# Patient Record
Sex: Female | Born: 1970 | Race: Black or African American | Hispanic: No | Marital: Married | Smoking: Former smoker
Health system: Southern US, Community
[De-identification: ages and names within clinical notes are randomized; demographics above are authoritative.]

## PROBLEM LIST (undated history)

## (undated) DIAGNOSIS — D649 Anemia, unspecified: Secondary | ICD-10-CM

## (undated) DIAGNOSIS — M1990 Unspecified osteoarthritis, unspecified site: Secondary | ICD-10-CM

## (undated) DIAGNOSIS — E079 Disorder of thyroid, unspecified: Secondary | ICD-10-CM

## (undated) DIAGNOSIS — G259 Extrapyramidal and movement disorder, unspecified: Secondary | ICD-10-CM

## (undated) DIAGNOSIS — C801 Malignant (primary) neoplasm, unspecified: Secondary | ICD-10-CM

## (undated) DIAGNOSIS — H539 Unspecified visual disturbance: Secondary | ICD-10-CM

## (undated) DIAGNOSIS — G35 Multiple sclerosis: Secondary | ICD-10-CM

## (undated) DIAGNOSIS — G43009 Migraine without aura, not intractable, without status migrainosus: Secondary | ICD-10-CM

## (undated) DIAGNOSIS — C73 Malignant neoplasm of thyroid gland: Secondary | ICD-10-CM

## (undated) DIAGNOSIS — G35D Multiple sclerosis, unspecified: Secondary | ICD-10-CM

## (undated) HISTORY — DX: Unspecified visual disturbance: H53.9

## (undated) HISTORY — DX: Malignant (primary) neoplasm, unspecified: C80.1

## (undated) HISTORY — DX: Disorder of thyroid, unspecified: E07.9

## (undated) HISTORY — DX: Anemia, unspecified: D64.9

## (undated) HISTORY — DX: Extrapyramidal and movement disorder, unspecified: G25.9

## (undated) HISTORY — PX: KNEE SURGERY: SHX244

## (undated) HISTORY — PX: OTHER SURGICAL HISTORY: SHX169

## (undated) HISTORY — DX: Multiple sclerosis, unspecified: G35.D

## (undated) HISTORY — PX: JOINT REPLACEMENT: SHX530

## (undated) HISTORY — DX: Malignant neoplasm of thyroid gland: C73

## (undated) HISTORY — DX: Multiple sclerosis: G35

## (undated) HISTORY — DX: Unspecified osteoarthritis, unspecified site: M19.90

## (undated) HISTORY — DX: Migraine without aura, not intractable, without status migrainosus: G43.009

## (undated) HISTORY — PX: THYROIDECTOMY: SHX17

## (undated) HISTORY — PX: PATELLA RECONSTRUCTION: SHX736

---

## 1997-10-14 ENCOUNTER — Encounter (HOSPITAL_COMMUNITY): Admission: RE | Admit: 1997-10-14 | Discharge: 1998-01-12 | Payer: Self-pay | Admitting: Obstetrics and Gynecology

## 1998-02-18 ENCOUNTER — Encounter (HOSPITAL_COMMUNITY): Admission: RE | Admit: 1998-02-18 | Discharge: 1998-05-19 | Payer: Self-pay | Admitting: *Deleted

## 1998-06-12 ENCOUNTER — Encounter (HOSPITAL_COMMUNITY): Admission: RE | Admit: 1998-06-12 | Discharge: 1998-09-10 | Payer: Self-pay | Admitting: *Deleted

## 1998-09-13 ENCOUNTER — Encounter (HOSPITAL_COMMUNITY): Admission: RE | Admit: 1998-09-13 | Discharge: 1998-12-12 | Payer: Self-pay | Admitting: *Deleted

## 1998-12-13 ENCOUNTER — Encounter (HOSPITAL_COMMUNITY): Admission: RE | Admit: 1998-12-13 | Discharge: 1999-03-13 | Payer: Self-pay | Admitting: *Deleted

## 1999-11-21 DIAGNOSIS — Z9889 Other specified postprocedural states: Secondary | ICD-10-CM | POA: Insufficient documentation

## 2003-04-26 ENCOUNTER — Ambulatory Visit (HOSPITAL_COMMUNITY): Admission: RE | Admit: 2003-04-26 | Discharge: 2003-04-26 | Payer: Self-pay | Admitting: Neurology

## 2003-04-26 ENCOUNTER — Encounter (INDEPENDENT_AMBULATORY_CARE_PROVIDER_SITE_OTHER): Payer: Self-pay

## 2003-05-03 ENCOUNTER — Ambulatory Visit (HOSPITAL_COMMUNITY): Admission: AD | Admit: 2003-05-03 | Discharge: 2003-05-05 | Payer: Self-pay | Admitting: Neurology

## 2003-08-06 ENCOUNTER — Emergency Department (HOSPITAL_COMMUNITY): Admission: EM | Admit: 2003-08-06 | Discharge: 2003-08-07 | Payer: Self-pay | Admitting: Emergency Medicine

## 2004-03-19 ENCOUNTER — Ambulatory Visit (HOSPITAL_COMMUNITY): Admission: RE | Admit: 2004-03-19 | Discharge: 2004-03-19 | Payer: Self-pay | Admitting: Neurology

## 2009-06-09 ENCOUNTER — Encounter: Admission: RE | Admit: 2009-06-09 | Discharge: 2009-06-09 | Payer: Self-pay | Admitting: Emergency Medicine

## 2011-01-29 NOTE — Discharge Summary (Signed)
NAME:  Allison Guzman, Allison Guzman                        ACCOUNT NO.:  [id number]   MEDICAL RECORD NO.:  [id number]                   PATIENT TYPE:  INP   LOCATION:  3020                                 FACILITY:  [facility]   PHYSICIAN:  Deanna Artis. Sharene Skeans, M.D.           DATE OF BIRTH:  1971-05-11   DATE OF ADMISSION:  05/03/2003  DATE OF DISCHARGE:  05/05/2003                                 DISCHARGE SUMMARY   FINAL DIAGNOSES:  1. Multiple sclerosis 340.  2. Diplopia.  3. Dysequilibrium.  4. Obesity.   PROCEDURE:  Intravenous Solu-Medrol x3 days.   COMPLICATIONS:  None.   SUMMARY OF THE HOSPITALIZATION:  The patient is a 40 year old woman with  relapsing, remitting multiple sclerosis recently diagnosed with an abnormal  visual volt potential in the left eye suggesting optic neuritis, and  abnormal lumbar puncture showing positive oligoclonal bands as well as a  compatible MR scan.  This suggests a diagnosis of definite multiple  sclerosis.   The patient was admitted to [facility] with a history of several  weeks of dysequilibrium and diplopia.  She was treated with 125 mg of Solu-  Medrol for the first dose in order to assess tolerability and then given 250  mg for the next two doses.  She developed a headache that was unresponsive  to Tylenol but responded to Darvocet N 100.  On day two of the treatment,  the patient stated that her diplopia seemed to be less.  See examination  below.   LABORATORY DATA:  Sodium 135, potassium 4.5, chloride 107, CO2 24, glucose  114 (slightly elevated), in a fasting test, BUN 9, creatinine 0.7, calcium  9.5.   EXAMINATION ON THE DAY OF DISCHARGE:  VITAL SIGNS: Blood pressure 120/55,  resting pulse 80, respirations 18, temperature 98.2.  LUNGS: Clear.  HEART: No murmurs.  Pulses normal.  ABDOMEN: Protuberant, bowel sounds normal, no hepatosplenomegaly.  EXTREMITIES: Are well formed.  NEUROLOGIC EXAMINATION: The patient was awake, alert  without dysphasia or  dyspraxia.  Cranial nerves - round reactive pupils, she did not have an  effort pupillary defect.  Visual acuity 20/20 OD, 20/30 OS with a hand-held  card.  Redmond's test shows horizontal diplopia in all fields of gaze,  neutral position.  She had diplopia that was crossed with the light to the  right (red lens was over the right eye).  In gaze to the right the distance  narrowed more so than to the left where it was about the same and gaze  superiorly, diplopia widened generally in comparison with neutral position.  An inferior gaze she nearly had monocular vision in inferior central  position.  She had narrowing of her diplopia to right inferior gaze and  actually had change in the diplopia with the light image being to the left  in left inferior gaze.   Symmetrical facial strength, midline tongue and uvula air conduction greater  than bone conduction.  Motor examination - normal strength, tone and mass,  good fine motor movements, no drift.  Sensory examination - intact to  primary and cortical modalities.  Gait was slightly broad based but she can  tandem.  She has a negative Romberg.  Deep tendon reflexes are diminished.  The patient had bilateral flexor plantar responses.   DISPOSITION:  The patient will be discharged after her third treatment of  Solu-Medrol.  She is to start Rebif next week.  She will follow up with Dr.  Vickey Huger in our office at time of their mutual choosing.  Physical therapy  came to see the patient, but found that she was walking well and signed off.  The patient is discharged in somewhat improved condition feeling that  subjectively her double vision has improved.                                                Deanna Artis. Sharene Skeans, M.D.    [facility]  D:  05/05/2003  T:  05/06/2003  Job:  [id number]

## 2011-06-17 DIAGNOSIS — C73 Malignant neoplasm of thyroid gland: Secondary | ICD-10-CM | POA: Insufficient documentation

## 2012-10-31 ENCOUNTER — Ambulatory Visit (INDEPENDENT_AMBULATORY_CARE_PROVIDER_SITE_OTHER): Payer: BC Managed Care – PPO | Admitting: Physician Assistant

## 2012-10-31 VITALS — BP 102/84 | HR 100 | Temp 98.0°F | Resp 16 | Ht 62.5 in | Wt 225.0 lb

## 2012-10-31 DIAGNOSIS — E039 Hypothyroidism, unspecified: Secondary | ICD-10-CM | POA: Insufficient documentation

## 2012-10-31 DIAGNOSIS — R05 Cough: Secondary | ICD-10-CM

## 2012-10-31 DIAGNOSIS — G35 Multiple sclerosis: Secondary | ICD-10-CM | POA: Insufficient documentation

## 2012-10-31 DIAGNOSIS — J329 Chronic sinusitis, unspecified: Secondary | ICD-10-CM

## 2012-10-31 DIAGNOSIS — J069 Acute upper respiratory infection, unspecified: Secondary | ICD-10-CM

## 2012-10-31 MED ORDER — AMOXICILLIN 875 MG PO TABS: 1750.0000 mg | ORAL_TABLET | Freq: Two times a day (BID) | ORAL | Status: DC

## 2012-10-31 MED ORDER — IPRATROPIUM BROMIDE 0.03 % NA SOLN: 2.0000 | Freq: Two times a day (BID) | NASAL | Status: DC

## 2012-10-31 MED ORDER — HYDROCOD POLST-CHLORPHEN POLST 10-8 MG/5ML PO LQCR: 5.0000 mL | Freq: Two times a day (BID) | ORAL | Status: DC | PRN

## 2012-10-31 NOTE — Progress Notes (Addendum)
  Subjective:    Patient ID: Allison Guzman, female    DOB: Jan 05, 1971, 42 y.o.   MRN: [id number]  HPI A 42 year old female presents today for cough, fever, and sinus congestion for the past 7 days.  She states that her cough was the first thing to start followed by fever and sinus congestion.  She states the sinus progression is worsening.  She denies productive cough.  Patient has history of tubal ligation and MS.  NKDA.   Patient Active Problem List  Diagnosis  . Hypothyroidism  . Multiple sclerosis   History   Social History  . Marital Status: Legally Separated    Spouse Name: N/A    Number of Children: N/A  . Years of Education: N/A   Occupational History  . Not on file.   Social History Main Topics  . Smoking status: Never Smoker   . Smokeless tobacco: Never Used  . Alcohol Use: No  . Drug Use: No  . Sexually Active: Yes    Birth Control/ Protection: Surgical   Family History  Problem Relation Age of Onset  . Cancer Maternal Grandmother   . Cancer Maternal Grandfather   . Heart disease Paternal Grandmother   . Heart disease Paternal Grandfather     Review of Systems   as above Objective:   Physical Exam BP 102/84  Pulse 100  Temp(Src) 98 F (36.7 C) (Oral)  Resp 16  Ht 5' 2.5" (1.588 m)  Wt 225 lb (102.059 kg)  BMI 40.47 kg/m2  SpO2 100%  LMP 10/31/2012  HEENT:  Erythema of right TM, no buldging.  Edema and erythema of turbinates.  No tonsillar exudate. Tender over right and left tragus.  Negative tenderness over auricles. Neck:  Supple.  No lymphadenopathy. Heart: Normal S1, S2. No m/g/r. Lungs:  CTAB   Family History  Problem Relation Age of Onset  . Cancer Maternal Grandmother   . Cancer Maternal Grandfather   . Heart disease Paternal Grandmother   . Heart disease Paternal Grandfather    Past Surgical History  Procedure Laterality Date  . Joint replacement    . Thyroidectomy    . Patella reconstruction         History   Social  History  . Marital Status: Legally Separated    Spouse Name: N/A    Number of Children: N/A  . Years of Education: N/A   Occupational History  . Not on file.   Social History Main Topics  . Smoking status: Never Smoker   . Smokeless tobacco: Never Used  . Alcohol Use: No  . Drug Use: No  . Sexually Active: Yes    Birth Control/ Protection: Surgical   Other Topics Concern  . Not on file   Social History Narrative  . No narrative on file    Assessment & Plan:   URI (upper respiratory infection) - Plan: ipratropium (ATROVENT) 0.03 % nasal spray  Cough - Plan: chlorpheniramine-HYDROcodone (TUSSIONEX PENNKINETIC ER) 10-8 MG/5ML LQCR  Sinusitis - Plan: amoxicillin (AMOXIL) 875 MG tablet  Patient Instructions  Get plenty of rest and drink at least 64 ounces of water daily. Take Tussionex as prescribed.  Tussionex will make you drowsy so recommend only take at bedtime or when you do not plan on driving or working.   You can take Tylenol as needed for muscle pain/soreness and/or fever.

## 2012-10-31 NOTE — Patient Instructions (Addendum)
Get plenty of rest and drink at least 64 ounces of water daily. Take Tussionex as prescribed.  Tussionex will make you drowsy so recommend only take at bedtime or when you do not plan on driving or working.   You can take Tylenol as needed for muscle pain/soreness and/or fever.

## 2013-09-27 DIAGNOSIS — N921 Excessive and frequent menstruation with irregular cycle: Secondary | ICD-10-CM | POA: Insufficient documentation

## 2014-10-01 ENCOUNTER — Encounter: Payer: Self-pay | Admitting: Neurology

## 2014-10-01 ENCOUNTER — Ambulatory Visit (INDEPENDENT_AMBULATORY_CARE_PROVIDER_SITE_OTHER): Payer: BLUE CROSS/BLUE SHIELD | Admitting: Neurology

## 2014-10-01 VITALS — BP 110/78 | HR 72 | Resp 14 | Ht 61.75 in | Wt 245.0 lb

## 2014-10-01 DIAGNOSIS — R5382 Chronic fatigue, unspecified: Secondary | ICD-10-CM

## 2014-10-01 DIAGNOSIS — R3911 Hesitancy of micturition: Secondary | ICD-10-CM | POA: Insufficient documentation

## 2014-10-01 DIAGNOSIS — G35 Multiple sclerosis: Secondary | ICD-10-CM

## 2014-10-01 DIAGNOSIS — E890 Postprocedural hypothyroidism: Secondary | ICD-10-CM

## 2014-10-01 DIAGNOSIS — R269 Unspecified abnormalities of gait and mobility: Secondary | ICD-10-CM | POA: Insufficient documentation

## 2014-10-01 DIAGNOSIS — F4323 Adjustment disorder with mixed anxiety and depressed mood: Secondary | ICD-10-CM | POA: Insufficient documentation

## 2014-10-01 DIAGNOSIS — Z79899 Other long term (current) drug therapy: Secondary | ICD-10-CM | POA: Insufficient documentation

## 2014-10-01 MED ORDER — DEXTROAMPHETAMINE SULFATE 15 MG PO TABS: 15.0000 mg | ORAL_TABLET | Freq: Three times a day (TID) | ORAL | Status: DC

## 2014-10-01 MED ORDER — TAMSULOSIN HCL 0.4 MG PO CAPS: ORAL_CAPSULE | ORAL | Status: DC

## 2014-10-01 NOTE — Patient Instructions (Signed)

## 2014-10-01 NOTE — Progress Notes (Signed)
[facility]  PATIENT: Allison Guzman DOB: 01-30-1971  REFERRING CLINICIAN:  Heather Spry HISTORY FROM:  patient REASON FOR VISIT:  Multiple sclerosis.   HISTORICAL  CHIEF COMPLAINT:  Chief Complaint  Patient presents with  . Multiple Sclerosis    Sts. spasticity in legs is some worse.  Sts. she is taking Tizanidine as rx. but doesn't think it provides enough relief./fim    HISTORY OF PRESENT ILLNESS:   Allison Guzman is a 44 year old woman who was diagnosed with relapsing remitting MS in 2004 after presenting with diplopia and vertigo. She had an MRI of the brain and a lumbar puncture performed. Both consistent with MS and she was admitted to [facility] for a five-day course of IV steroids.    Dr. Brett Fairy placed her on Rebif.  She had a couple of exacerbations while on Rebif with some effect on her gait.   She was switched to Copaxone plus estriol. Additionally, CellCept was tried for short while.  Unfortunately, she continued to have breakthrough exacerbations. Around 2008, she started Tysabri. She did fairly well on Tysabri with one definite exacerbation in 2012.    She was also JCV antibody positive. Therefore, she opted to switch to [place]en[place].   However, she continued to have exacerbations and disease activity on MRI and she switched to Tecfidera.   For about one year she was on Tecfidera but had a lot of GI side effects with nausea. Additionally she had breakthrough disease. December 2015 she received Lemtrada at 12 mg per day for 5 days. She tolerated the infusion fairly well with no significant systemic side effects or allergic reactions. She is enrolled into the risk management program and is receiving lab work on a monthly basis. We had previously discussed the risk of autoimmune disease with Lemtrada. She is post operative with thyroidectomy due to thyroid cancer.   A little tissue remains but is being suppressed.   She is on supplementation.  She has several  symptoms from the MS including a poor gait, leg numbness , mild leg weakness and clumsiness and bladder dysfunction.  She has chronic fatigue and mild depression.  She also reports difficulties with back pain.  She is able to walk without a cane but is off balance. She has more weakness in the right leg than the left leg and sometimes she has a tremor in that leg. There is spasticity in the legs. Tysabri has helped the spasticity some but it makes her sleepy, especially the time release formulation. Baclofen did not help her spasticity is much.   She also reports that there is some diplopia at times. This has been present for many years and is worse when she is more tired.  She had some urinary incontinence recently after urinary tract infection and needed to wear Depends. She reports urinary hesitancy and does not feel that she empties.   Several years ago, she was on either Detrol or Ditropan with mild benefit.  She has never tried tamsulosin.  She has fatigue on a daily basis. Additionally she has some hypersomnolence and falls asleep very easily. Dextroamphetamine helped her some and she takes usually only on a week day.   She usually sleeps fairly well at night. She gets about 4-6 hours nightly.   She has had some difficulty with depression. She is currently on citalopram with some benefit. On that medicine she notes less anxiety and irritability, as well.    REVIEW OF SYSTEMS:  Constitutional: No fevers, chills,  sweats, or change in appetite Eyes:  She notes double vision.   No eye pain Ear, nose and throat: No hearing loss, ear pain, nasal congestion, sore throat Cardiovascular: No chest pain, palpitations Respiratory:  No shortness of breath at rest or with exertion.   No wheezes GastrointestinaI: No nausea, vomiting, diarrhea, abdominal pain, fecal incontinence Genitourinary:  see above. Musculoskeletal:  No neck pain, back pain Integumentary: No rash, pruritus, skin  lesions Neurological: as above Psychiatric: No depression at this time.  No anxiety Endocrine: No palpitations, diaphoresis, change in appetite, change in weigh or increased thirst Hematologic/Lymphatic:  No anemia, purpura, petechiae. Allergic/Immunologic: No itchy/runny eyes, nasal congestion, recent allergic reactions, rashes  ALLERGIES: No Known Allergies  HOME MEDICATIONS: Outpatient Prescriptions Prior to Visit  Medication Sig Dispense Refill  . amoxicillin (AMOXIL) 875 MG tablet Take 2 tablets (1,750 mg total) by mouth 2 (two) times daily. 20 tablet 0  . chlorpheniramine-HYDROcodone (TUSSIONEX PENNKINETIC ER) 10-8 MG/5ML LQCR Take 5 mLs by mouth every 12 (twelve) hours as needed (cough). 140 mL 0  . ipratropium (ATROVENT) 0.03 % nasal spray Place 2 sprays into the nose 2 (two) times daily. 30 mL 0  . levothyroxine (SYNTHROID, LEVOTHROID) 150 MCG tablet Take 150 mcg by mouth daily.    Marland Kitchen levothyroxine (SYNTHROID, LEVOTHROID) 175 MCG tablet Take 175 mcg by mouth daily.     No facility-administered medications prior to visit.    PAST MEDICAL HISTORY: Past Medical History  Diagnosis Date  . Arthritis   . Anemia   . Thyroid disease   . Multiple sclerosis   . Cancer   . Thyroid cancer     2010  . Movement disorder   . Vision abnormalities     PAST SURGICAL HISTORY: Past Surgical History  Procedure Laterality Date  . Joint replacement    . Thyroidectomy    . Patella reconstruction      FAMILY HISTORY: Family History  Problem Relation Age of Onset  . Cancer Maternal Grandmother   . Cancer Maternal Grandfather   . Heart disease Paternal Grandmother   . Heart disease Paternal Grandfather   . Healthy Mother   . Thyroid cancer Father   . Healthy Brother   . Healthy Sister   . Healthy Sister     SOCIAL HISTORY:  History   Social History  . Marital Status: Legally Separated    Spouse Name: N/A    Number of Children: N/A  . Years of Education: N/A    Occupational History  . Not on file.   Social History Main Topics  . Smoking status: Former Smoker    Quit date: 10/02/2003  . Smokeless tobacco: Never Used  . Alcohol Use: No  . Drug Use: No  . Sexual Activity: Yes    Birth Control/ Protection: Surgical   Other Topics Concern  . Not on file   Social History Narrative     PHYSICAL EXAM  Filed Vitals:   10/01/14 1003  BP: 110/78  Pulse: 72  Resp: 14  Height: 5' 1.75" (1.568 m)  Weight: 245 lb (111.131 kg)    Body mass index is 45.2 kg/(m^2).   General: The patient is well-developed and well-nourished and in no acute distress  Eyes:  Funduscopic exam shows normal optic discs and retinal vessels.  Neck: The neck is supple, no carotid bruits are noted.  The neck is nontender.  Respiratory: The respiratory examination is clear.  Cardiovascular: The cardiovascular examination reveals a regular rate and rhythm,  no murmurs, gallops or rubs are noted.  Skin: Extremities are without significant edema.  Neurologic Exam  Mental status: The patient is alert and oriented x 3 at the time of the examination. The patient has apparent normal recent and remote memory, with an apparently normal attention span and concentration ability.   Speech is normal.  Cranial nerves: Extraocular movements are full. Pupils are equal, round, and reactive to light and accomodation.  Visual fields are full.  Facial symmetry is present. There is good facial sensation to soft touch bilaterally.Facial strength is normal.  Trapezius and sternocleidomastoid strength is normal. No dysarthria is noted.  The tongue is midline, and the patient has symmetric elevation of the soft palate. No obvious hearing deficits are noted.  Motor:  Muscle bulk is norma but tome is increased in leg.  Strength is  5 / 5 in the arms and 4/5 right leg and 4+/5 in left leg.   Sensory: Sensory testing is intact to pinprick, soft touch, vibration sensation, and position sense  on all 4 extremities.  Coordination: Cerebellar testing reveals slightly reduced  finger-nose-finger and poor heel -to-shin bilaterally.  Gait and station: Station is norma and gait is wide    She cannot tandem. Romberg is positive.   Reflexes: Deep tendon reflexes are symmetric and brisk bilaterally. Nonsustained clonus in right leg.  Plantar responses are normal.    DIAGNOSTIC DATA (LABS, IMAGING, TESTING) - I reviewed patient records, labs, notes, testing and imaging myself where available.    ASSESSMENT AND PLAN  Abnormality of gait  High risk medication use  Multiple sclerosis  Postoperative hypothyroidism  Adjustment disorder with mixed anxiety and depressed mood  Chronic fatigue  Urinary hesitancy  Summary, she needed constant is a 44 year old woman with relapsing remitting multiple sclerosis who had a course of Lemtrada in December 2015. She tolerated the infusion well and feels that her MS has been stable over the last month. She understands the risk of autoimmune diseases with [name] and will do the monthly blood work and urinalysis for the next 48 months.we will plan on doing her next infusion (36 mg over 3 days) in December 2016.her main impairments from the MS or poor gait and fatigue. She is advised to use a cane for his not to. He will continue to be active. I will increase her amphetamine from 50 mg twice a day to 15 mg up to 3 times a day.  She will return to see me in about 4 months. However, she is advised to call me sooner if she has any new or worsening neurologic symptoms.   Galina Haddox A. Felecia Shelling, MD, PhD 0/26/3785, 88:50 AM Certified in Neurology, Clinical Neurophysiology, Sleep Medicine, Pain Medicine and Neuroimaging  [facility] [address] [place], [place] [address] [phone]

## 2014-10-03 ENCOUNTER — Telehealth: Payer: Self-pay | Admitting: *Deleted

## 2014-10-03 ENCOUNTER — Other Ambulatory Visit: Payer: Self-pay | Admitting: Neurology

## 2014-10-03 MED ORDER — METHYLPREDNISOLONE (PAK) 4 MG PO TABS: ORAL_TABLET | ORAL | Status: DC

## 2014-10-03 NOTE — Telephone Encounter (Signed)
I called and LMVM for pt that prescriptions sent to pharmacy.

## 2014-10-03 NOTE — Telephone Encounter (Signed)
I sent in to pharmacy 

## 2014-10-03 NOTE — Telephone Encounter (Signed)
Arwen c/o blurry vision, increased spasticity, back pain for 2 weeks--is requesting medrol dose pk.  [facility] on [address]fim

## 2014-10-03 NOTE — Telephone Encounter (Signed)
Pt called and is asking for pred dosepack.  Having back pain, double vision, and off balance for 2 wks now.

## 2014-10-07 ENCOUNTER — Telehealth: Payer: Self-pay | Admitting: *Deleted

## 2014-10-07 NOTE — Telephone Encounter (Signed)
LMOM for Allison Guzman to check with her pharmacy for Medrol Dose pk if she hasn't already--RAS sent this in last Thursday/fim

## 2014-10-09 ENCOUNTER — Encounter: Payer: Self-pay | Admitting: Neurology

## 2014-11-08 ENCOUNTER — Telehealth: Payer: Self-pay | Admitting: *Deleted

## 2014-11-08 NOTE — Telephone Encounter (Signed)
Patient calling because she has bruising down the right side of face. Patient would like a call back. Please advise

## 2014-11-11 ENCOUNTER — Other Ambulatory Visit: Payer: Self-pay | Admitting: *Deleted

## 2014-11-11 ENCOUNTER — Telehealth: Payer: Self-pay

## 2014-11-11 MED ORDER — AMPHETAMINE-DEXTROAMPHETAMINE 15 MG PO TABS: 15.0000 mg | ORAL_TABLET | Freq: Three times a day (TID) | ORAL | Status: DC

## 2014-11-11 MED ORDER — AMPHETAMINE-DEXTROAMPHETAMINE 15 MG PO TABS: 15.0000 mg | ORAL_TABLET | Freq: Every day | ORAL | Status: DC

## 2014-11-11 NOTE — Telephone Encounter (Signed)
Patient calling wanting to know if she could change to Adderall. And the bruising she thought she had on her face was actually air dye. Patient hair color was bleeding on her face.  [phone]

## 2014-11-11 NOTE — Telephone Encounter (Signed)
Spoke with Zinnia and per RAS, advised ok to switch to Adderall 15mg  tid.  Advised rx. will be ready this afternoon/fim

## 2014-11-11 NOTE — Telephone Encounter (Signed)
Spoke with Manon Hilding and advised that per RAS, ok to switch to Adderall 15mg  tid.  She verbalized understanding of same.  Rx. printed, signed, placed in [place] office to go up front/fim

## 2014-11-11 NOTE — Telephone Encounter (Signed)
Called patient and informed Rx ready for pick up at front desk. Patient verbalized understanding.  

## 2014-11-11 NOTE — Telephone Encounter (Signed)
Ok to change to Adderall 15 mg po tid  #90  Good that there was no bruising

## 2014-11-25 ENCOUNTER — Telehealth: Payer: Self-pay | Admitting: Neurology

## 2014-11-25 NOTE — Telephone Encounter (Signed)
As of the present time, we have not gotten the fax from the patient yet.  I called the patient, got no answer.  Left message.  I called the pharmacy.  They do not have new ins on file there either.

## 2014-11-25 NOTE — Telephone Encounter (Addendum)
Patient is calling as her new insurance [employer] needs authorizaion from doctor for  Rx Adderall 15 mg.  Patient would not give me new ID # and stated she would fax.  Please call.

## 2014-11-26 ENCOUNTER — Telehealth: Payer: Self-pay

## 2014-11-26 NOTE — Telephone Encounter (Signed)
I called the patient back to clarify.  Got no answer.  Left message.

## 2014-11-26 NOTE — Telephone Encounter (Signed)
I spoke with patient who says her new ins has restrictions and will not allow coverage for greater than two Adderall tabs daily.  She would like to know if a new Rx can be written with directions of one tablet twice daily instead of one tablet three times daily.  Please advise.  Thank you.

## 2014-11-26 NOTE — Telephone Encounter (Signed)
Patient requesting 30 day supply for refill of Rx amphetamine-dextroamphetamine (ADDERALL) 15 MG tablet.  States insurance will not pay unless prescription reads 2 tabs twice a day, with a quantity of 60 tablets.  Please call and advise.

## 2014-11-27 NOTE — Telephone Encounter (Signed)
Ok to write for 2 x day

## 2014-11-27 NOTE — Telephone Encounter (Signed)
Thank you.  Rx updated on med list, new Rx will need to be printed.

## 2014-12-03 ENCOUNTER — Other Ambulatory Visit: Payer: Self-pay

## 2014-12-03 MED ORDER — AMPHETAMINE-DEXTROAMPHETAMINE 15 MG PO TABS
15.0000 mg | ORAL_TABLET | Freq: Two times a day (BID) | ORAL | Status: DC
Start: 2014-12-03 — End: 2015-03-20

## 2014-12-03 NOTE — Telephone Encounter (Signed)
Pt is calling back to check on written Rx for amphetamine-dextroamphetamine (ADDERALL) 15 MG tablet. Please call and advise.  Pt states to call her at work @ [phone] ext. [phone].

## 2014-12-18 ENCOUNTER — Other Ambulatory Visit: Payer: Self-pay | Admitting: Obstetrics & Gynecology

## 2014-12-27 ENCOUNTER — Ambulatory Visit (HOSPITAL_COMMUNITY)
Admission: RE | Admit: 2014-12-27 | Discharge: 2014-12-27 | Disposition: A | Discharge status: Home / Self Care | Payer: BLUE CROSS/BLUE SHIELD | Source: Ambulatory Visit | Attending: Obstetrics & Gynecology | Admitting: Obstetrics & Gynecology

## 2014-12-27 ENCOUNTER — Encounter (HOSPITAL_COMMUNITY)
Admission: RE | Disposition: A | Discharge status: Home / Self Care | Payer: Self-pay | Source: Ambulatory Visit | Attending: Obstetrics & Gynecology

## 2014-12-27 ENCOUNTER — Encounter (HOSPITAL_COMMUNITY): Payer: Self-pay | Admitting: Anesthesiology

## 2014-12-27 ENCOUNTER — Ambulatory Visit (HOSPITAL_COMMUNITY): Payer: BLUE CROSS/BLUE SHIELD | Admitting: Anesthesiology

## 2014-12-27 DIAGNOSIS — Z87442 Personal history of urinary calculi: Secondary | ICD-10-CM | POA: Insufficient documentation

## 2014-12-27 DIAGNOSIS — Z6841 Body Mass Index (BMI) 40.0 and over, adult: Secondary | ICD-10-CM | POA: Insufficient documentation

## 2014-12-27 DIAGNOSIS — R102 Pelvic and perineal pain: Secondary | ICD-10-CM | POA: Insufficient documentation

## 2014-12-27 DIAGNOSIS — Z87891 Personal history of nicotine dependence: Secondary | ICD-10-CM | POA: Diagnosis not present

## 2014-12-27 DIAGNOSIS — E039 Hypothyroidism, unspecified: Secondary | ICD-10-CM | POA: Diagnosis not present

## 2014-12-27 DIAGNOSIS — G35 Multiple sclerosis: Secondary | ICD-10-CM | POA: Diagnosis not present

## 2014-12-27 HISTORY — PX: IUD REMOVAL: SHX5392

## 2014-12-27 HISTORY — PX: LAPAROSCOPY: SHX197

## 2014-12-27 LAB — CBC
HEMATOCRIT: 39.3 % (ref 36.0–46.0)
HEMOGLOBIN: 13.1 g/dL (ref 12.0–15.0)
MCH: 29.3 pg (ref 26.0–34.0)
MCHC: 33.3 g/dL (ref 30.0–36.0)
MCV: 87.9 fL (ref 78.0–100.0)
Platelets: 244 10*3/uL (ref 150–400)
RBC: 4.47 MIL/uL (ref 3.87–5.11)
RDW: 14.4 % (ref 11.5–15.5)
WBC: 3.5 10*3/uL — ABNORMAL LOW (ref 4.0–10.5)

## 2014-12-27 SURGERY — LAPAROSCOPY, DIAGNOSTIC
Anesthesia: General | Site: Vagina

## 2014-12-27 MED ORDER — BUPIVACAINE HCL (PF) 0.25 % IJ SOLN
INTRAMUSCULAR | Status: DC | PRN
  Administered 2014-12-27: 18 mL

## 2014-12-27 MED ORDER — OXYCODONE-ACETAMINOPHEN 5-325 MG PO TABS: 1.0000 | ORAL_TABLET | Freq: Once | ORAL | Status: DC | PRN

## 2014-12-27 MED ORDER — LACTATED RINGERS IV SOLN
INTRAVENOUS | Status: DC
  Administered 2014-12-27 (×3): via INTRAVENOUS

## 2014-12-27 MED ORDER — PROPOFOL 10 MG/ML IV BOLUS
INTRAVENOUS | Status: DC | PRN
  Administered 2014-12-27: 200 mg via INTRAVENOUS

## 2014-12-27 MED ORDER — METHYLENE BLUE 1 % INJ SOLN
INTRAMUSCULAR | Status: AC
  Filled 2014-12-27: qty 1

## 2014-12-27 MED ORDER — MIDAZOLAM HCL 2 MG/2ML IJ SOLN
INTRAMUSCULAR | Status: AC
  Filled 2014-12-27: qty 2

## 2014-12-27 MED ORDER — DEXAMETHASONE SODIUM PHOSPHATE 4 MG/ML IJ SOLN
INTRAMUSCULAR | Status: AC
  Filled 2014-12-27: qty 1

## 2014-12-27 MED ORDER — EPHEDRINE SULFATE 50 MG/ML IJ SOLN
INTRAMUSCULAR | Status: DC | PRN
  Administered 2014-12-27 (×3): 5 mg via INTRAVENOUS
  Administered 2014-12-27: 10 mg via INTRAVENOUS
  Administered 2014-12-27 (×2): 5 mg via INTRAVENOUS

## 2014-12-27 MED ORDER — ONDANSETRON HCL 4 MG/2ML IJ SOLN
INTRAMUSCULAR | Status: AC
  Filled 2014-12-27: qty 2

## 2014-12-27 MED ORDER — ROCURONIUM BROMIDE 100 MG/10ML IV SOLN
INTRAVENOUS | Status: AC
  Filled 2014-12-27: qty 1

## 2014-12-27 MED ORDER — PROPOFOL 10 MG/ML IV BOLUS
INTRAVENOUS | Status: AC
  Filled 2014-12-27: qty 20

## 2014-12-27 MED ORDER — CEFAZOLIN SODIUM-DEXTROSE 2-3 GM-% IV SOLR
INTRAVENOUS | Status: AC
  Filled 2014-12-27: qty 50

## 2014-12-27 MED ORDER — DEXAMETHASONE SODIUM PHOSPHATE 4 MG/ML IJ SOLN
INTRAMUSCULAR | Status: DC | PRN
  Administered 2014-12-27: 4 mg via INTRAVENOUS

## 2014-12-27 MED ORDER — SILVER NITRATE-POT NITRATE 75-25 % EX MISC
CUTANEOUS | Status: AC
  Filled 2014-12-27: qty 1

## 2014-12-27 MED ORDER — CEFAZOLIN SODIUM-DEXTROSE 2-3 GM-% IV SOLR
2.0000 g | INTRAVENOUS | Status: AC
  Administered 2014-12-27: 2 g via INTRAVENOUS

## 2014-12-27 MED ORDER — KETOROLAC TROMETHAMINE 30 MG/ML IJ SOLN
INTRAMUSCULAR | Status: AC
  Filled 2014-12-27: qty 1

## 2014-12-27 MED ORDER — ONDANSETRON HCL 4 MG/2ML IJ SOLN
INTRAMUSCULAR | Status: DC | PRN
  Administered 2014-12-27: 4 mg via INTRAVENOUS

## 2014-12-27 MED ORDER — FENTANYL CITRATE (PF) 100 MCG/2ML IJ SOLN
25.0000 ug | INTRAMUSCULAR | Status: DC | PRN
  Administered 2014-12-27: 50 ug via INTRAVENOUS

## 2014-12-27 MED ORDER — FENTANYL CITRATE (PF) 100 MCG/2ML IJ SOLN
INTRAMUSCULAR | Status: DC | PRN
  Administered 2014-12-27 (×2): 50 ug via INTRAVENOUS

## 2014-12-27 MED ORDER — SCOPOLAMINE 1 MG/3DAYS TD PT72
MEDICATED_PATCH | TRANSDERMAL | Status: AC
  Administered 2014-12-27: 1.5 mg via TRANSDERMAL
  Filled 2014-12-27: qty 1

## 2014-12-27 MED ORDER — SCOPOLAMINE 1 MG/3DAYS TD PT72
1.0000 | MEDICATED_PATCH | Freq: Once | TRANSDERMAL | Status: DC
  Administered 2014-12-27: 1.5 mg via TRANSDERMAL

## 2014-12-27 MED ORDER — OXYCODONE-ACETAMINOPHEN 7.5-325 MG PO TABS: 1.0000 | ORAL_TABLET | ORAL | Status: DC | PRN

## 2014-12-27 MED ORDER — HEPARIN SODIUM (PORCINE) 5000 UNIT/ML IJ SOLN
INTRAMUSCULAR | Status: AC
  Filled 2014-12-27: qty 1

## 2014-12-27 MED ORDER — FENTANYL CITRATE (PF) 100 MCG/2ML IJ SOLN
INTRAMUSCULAR | Status: DC
Start: 2014-12-27 — End: 2014-12-27
  Filled 2014-12-27: qty 2

## 2014-12-27 MED ORDER — ROCURONIUM BROMIDE 100 MG/10ML IV SOLN
INTRAVENOUS | Status: DC | PRN
  Administered 2014-12-27: 20 mg via INTRAVENOUS

## 2014-12-27 MED ORDER — LIDOCAINE HCL (CARDIAC) 20 MG/ML IV SOLN
INTRAVENOUS | Status: DC | PRN
  Administered 2014-12-27: 80 mg via INTRAVENOUS

## 2014-12-27 MED ORDER — MIDAZOLAM HCL 2 MG/2ML IJ SOLN
INTRAMUSCULAR | Status: DC | PRN
Start: 2014-12-27 — End: 2014-12-27
  Administered 2014-12-27: 1 mg via INTRAVENOUS

## 2014-12-27 MED ORDER — FENTANYL CITRATE (PF) 250 MCG/5ML IJ SOLN
INTRAMUSCULAR | Status: AC
  Filled 2014-12-27: qty 5

## 2014-12-27 MED ORDER — KETOROLAC TROMETHAMINE 30 MG/ML IJ SOLN: 30.0000 mg | Freq: Once | INTRAMUSCULAR | Status: DC | PRN

## 2014-12-27 MED ORDER — BUPIVACAINE HCL (PF) 0.25 % IJ SOLN
INTRAMUSCULAR | Status: AC
  Filled 2014-12-27: qty 30

## 2014-12-27 MED ORDER — GLYCOPYRROLATE 0.2 MG/ML IJ SOLN
INTRAMUSCULAR | Status: AC
  Filled 2014-12-27: qty 3

## 2014-12-27 MED ORDER — ONDANSETRON HCL 4 MG/2ML IJ SOLN: 4.0000 mg | Freq: Once | INTRAMUSCULAR | Status: DC | PRN

## 2014-12-27 MED ORDER — LIDOCAINE HCL (CARDIAC) 20 MG/ML IV SOLN
INTRAVENOUS | Status: AC
  Filled 2014-12-27: qty 5

## 2014-12-27 MED ORDER — NEOSTIGMINE METHYLSULFATE 10 MG/10ML IV SOLN
INTRAVENOUS | Status: AC
  Filled 2014-12-27: qty 1

## 2014-12-27 MED ORDER — MEPERIDINE HCL 25 MG/ML IJ SOLN: 6.2500 mg | INTRAMUSCULAR | Status: DC | PRN

## 2014-12-27 SURGICAL SUPPLY — 29 items
APPLICATOR COTTON TIP 6IN STRL (MISCELLANEOUS) ×4 IMPLANT
CABLE HIGH FREQUENCY MONO STRZ (ELECTRODE) ×2 IMPLANT
CATH ROBINSON RED A/P 16FR (CATHETERS) ×4 IMPLANT
CLOTH BEACON ORANGE TIMEOUT ST (SAFETY) ×4 IMPLANT
DRSG COVADERM PLUS 2X2 (GAUZE/BANDAGES/DRESSINGS) ×6 IMPLANT
DRSG OPSITE POSTOP 3X4 (GAUZE/BANDAGES/DRESSINGS) ×2 IMPLANT
GLOVE BIO SURGEON STRL SZ 6.5 (GLOVE) ×3 IMPLANT
GLOVE BIO SURGEONS STRL SZ 6.5 (GLOVE) ×1
GLOVE BIOGEL PI IND STRL 7.0 (GLOVE) ×2 IMPLANT
GLOVE BIOGEL PI INDICATOR 7.0 (GLOVE) ×2
GOWN STRL REUS W/TWL LRG LVL3 (GOWN DISPOSABLE) ×12 IMPLANT
IV STOPCOCK 4 WAY 40  W/Y SET (IV SOLUTION)
IV STOPCOCK 4 WAY 40 W/Y SET (IV SOLUTION) IMPLANT
LIQUID BAND (GAUZE/BANDAGES/DRESSINGS) ×4 IMPLANT
MANIPULATOR UTERINE 4.5 ZUMI (MISCELLANEOUS) IMPLANT
PACK LAPAROSCOPY BASIN (CUSTOM PROCEDURE TRAY) ×4 IMPLANT
PAD POSITIONER PINK NONSTERILE (MISCELLANEOUS) ×4 IMPLANT
PENCIL BUTTON HOLSTER BLD 10FT (ELECTRODE) ×4 IMPLANT
PROTECTOR NERVE ULNAR (MISCELLANEOUS) ×4 IMPLANT
SET IRRIG TUBING LAPAROSCOPIC (IRRIGATION / IRRIGATOR) IMPLANT
SLEEVE XCEL OPT CAN 5 100 (ENDOMECHANICALS) ×2 IMPLANT
SUT MNCRL AB 4-0 PS2 18 (SUTURE) ×8 IMPLANT
SUT VICRYL 0 UR6 27IN ABS (SUTURE) ×8 IMPLANT
TOWEL OR 17X24 6PK STRL BLUE (TOWEL DISPOSABLE) ×8 IMPLANT
TRAY FOLEY CATH SILVER 14FR (SET/KITS/TRAYS/PACK) ×4 IMPLANT
TROCAR BALLN 12MMX100 BLUNT (TROCAR) ×4 IMPLANT
TROCAR XCEL NON-BLD 11X100MML (ENDOMECHANICALS) IMPLANT
TROCAR XCEL NON-BLD 5MMX100MML (ENDOMECHANICALS) ×4 IMPLANT
WATER STERILE IRR 1000ML POUR (IV SOLUTION) ×8 IMPLANT

## 2014-12-27 NOTE — Transfer of Care (Signed)
Immediate Anesthesia Transfer of Care Note  Patient: Allison Guzman  Procedure(s) Performed: Procedure(s): LAPAROSCOPY DIAGNOSTIC (N/A)  Patient Location: PACU  Anesthesia Type:General  Level of Consciousness: awake, alert , oriented and patient cooperative  Airway & Oxygen Therapy: Patient Spontanous Breathing and Patient connected to nasal cannula oxygen  Post-op Assessment: Report given to RN and Post -op Vital signs reviewed and stable  Post vital signs: Reviewed and stable  Last Vitals:  Filed Vitals:   12/27/14 0850  BP: 141/81  Pulse: 74  Temp: 37.1 C  Resp: 16    Complications: No apparent anesthesia complications

## 2014-12-27 NOTE — Anesthesia Preprocedure Evaluation (Addendum)
Anesthesia Evaluation  Patient identified by MRN, date of birth, ID band Patient awake    Reviewed: Allergy & Precautions, H&P , NPO status , Patient's Chart, lab work & pertinent test results, reviewed documented beta blocker date and time   Airway Mallampati: II  TM Distance: >3 FB Neck ROM: full    Dental no notable dental hx. (+) Teeth Intact   Pulmonary former smoker,  breath sounds clear to auscultation        Cardiovascular negative cardio ROS      Neuro/Psych negative neurological ROS     GI/Hepatic Neg liver ROS,   Endo/Other  Hypothyroidism Morbid obesity  Renal/GU negative Renal ROS     Musculoskeletal   Abdominal (+) + obese,   Peds  Hematology   Anesthesia Other Findings   Reproductive/Obstetrics negative OB ROS                            Anesthesia Physical Anesthesia Plan  ASA: III  Anesthesia Plan: General   Post-op Pain Management:    Induction: Intravenous  Airway Management Planned: Oral ETT  Additional Equipment:   Intra-op Plan:   Post-operative Plan: Extubation in OR  Informed Consent: I have reviewed the patients History and Physical, chart, labs and discussed the procedure including the risks, benefits and alternatives for the proposed anesthesia with the patient or authorized representative who has indicated his/her understanding and acceptance.   Dental Advisory Given  Plan Discussed with: CRNA, Surgeon and Anesthesiologist  Anesthesia Plan Comments:         Anesthesia Quick Evaluation

## 2014-12-27 NOTE — Anesthesia Postprocedure Evaluation (Signed)
  Anesthesia Post-op Note  Patient: Allison Guzman  Procedure(s) Performed: Procedure(s): LAPAROSCOPY DIAGNOSTIC  (N/A) INTRAUTERINE DEVICE (IUD) REMOVAL (N/A)  Patient Location: PACU  Anesthesia Type:General  Level of Consciousness: awake and alert   Airway and Oxygen Therapy: Patient Spontanous Breathing and Patient connected to nasal cannula oxygen  Post-op Pain: mild  Post-op Assessment: Post-op Vital signs reviewed, Patient's Cardiovascular Status Stable, Respiratory Function Stable, RESPIRATORY FUNCTION UNSTABLE and No signs of Nausea or vomiting  Post-op Vital Signs: Reviewed and stable  Last Vitals:  Filed Vitals:   12/27/14 0850  BP: 141/81  Pulse: 74  Temp: 37.1 C  Resp: 16    Complications: No apparent anesthesia complications

## 2014-12-27 NOTE — Discharge Summary (Signed)
  Physician Discharge Summary  Patient ID: Allison Guzman MRN: [id number] DOB/AGE: 1970-12-29 44 y.o.  Admit date: 12/27/2014 Discharge date: 12/27/2014  Admission Diagnoses: Persistent Right Pelvic Pain  Discharge Diagnoses: Persistent Right Pelvic Pain        Active Problems:   * No active hospital problems. *   Discharged Condition: good  Hospital Course: Outpatient  Consults: None  Treatments: surgery: Diagnostic Laparoscopy  Disposition: Home     Medication List    ASK your doctor about these medications        amphetamine-dextroamphetamine 15 MG tablet  Commonly known as:  ADDERALL  Take 1 tablet by mouth 2 (two) times daily.     B-12 PO  Take 1 tablet by mouth daily.     citalopram 20 MG tablet  Commonly known as:  CELEXA  Take 20 mg by mouth daily.     Dextroamphetamine Sulfate 15 MG Tabs  Take 15 mg by mouth 3 (three) times daily.     etodolac 400 MG tablet  Commonly known as:  LODINE  Take 400 mg by mouth daily as needed for moderate pain (MS).     ibuprofen 800 MG tablet  Commonly known as:  ADVIL,MOTRIN  Take 800 mg by mouth every 6 (six) hours as needed for moderate pain.     levothyroxine 125 MCG tablet  Commonly known as:  SYNTHROID, LEVOTHROID  Take 125 mcg by mouth daily before breakfast.     methylPREDNIsolone 4 MG tablet  Commonly known as:  MEDROL DOSPACK  follow package directions     omeprazole 40 MG capsule  Commonly known as:  PRILOSEC  Take 40 mg by mouth daily.     tamsulosin 0.4 MG Caps capsule  Commonly known as:  FLOMAX  One pill daily by mouth for bladder hesitancy     tiZANidine 4 MG capsule  Commonly known as:  ZANAFLEX  Take 4 mg by mouth 3 (three) times daily.     valACYclovir 500 MG tablet  Commonly known as:  VALTREX  Take 500 mg by mouth 2 (two) times daily.     Vitamin D3 10000 UNITS capsule  Take 10,000 Units by mouth daily.           Follow-up Information    Follow up with Allison Guzman,MARIE-LYNE, MD  In 3 weeks.   Specialty:  Obstetrics and Gynecology   Contact information:   [place] [address] [phone]       Signed: Princess Bruins, MD 12/27/2014, 11:05 AM

## 2014-12-27 NOTE — Anesthesia Procedure Notes (Signed)
Procedure Name: Intubation Date/Time: 12/27/2014 9:52 AM Performed by: Brock Ra Pre-anesthesia Checklist: Patient identified, Emergency Drugs available, Suction available, Patient being monitored and Timeout performed Patient Re-evaluated:Patient Re-evaluated prior to inductionOxygen Delivery Method: Circle system utilized Preoxygenation: Pre-oxygenation with 100% oxygen Intubation Type: IV induction, Cricoid Pressure applied and Rapid sequence Laryngoscope Size: Mac and 3 Grade View: Grade I Tube type: Oral Tube size: 7.0 mm Number of attempts: 1 Airway Equipment and Method: Stylet (Two blankets under head and shoulders provided excellent visualization of cords) Placement Confirmation: ETT inserted through vocal cords under direct vision,  breath sounds checked- equal and bilateral and positive ETCO2 Secured at: 20 (lips) cm Tube secured with: Tape Dental Injury: Teeth and Oropharynx as per pre-operative assessment  Comments: Modified Rapid Sequence induction

## 2014-12-27 NOTE — Discharge Instructions (Addendum)

## 2014-12-27 NOTE — Op Note (Signed)
12/27/2014  10:53 AM  PATIENT:  Allison Guzman  44 y.o. female  PRE-OPERATIVE DIAGNOSIS:  Persistent Right Pelvic Pain  POST-OPERATIVE DIAGNOSIS:  Persistent Right pelvic Pain  PROCEDURE:  Procedure(s): DIAGNOSTIC LAPAROSCOPY   SURGEON:  Surgeon(s): Princess Bruins, MD  ASSISTANTS: none   ANESTHESIA:   general   PROCEDURE:  Under general anesthesia with endotracheal intubation, the patient is an lithotomy position. She is prepped with ChloraPrep on the abdomen and with Betadine on the suprapubic, vulvar and vaginal areas. The Foley is inserted in the bladder. The speculum is inserted in the vagina and the anterior lip of the cervix was grasped with a tenaculum. The acorn cannula is inserted at the cervix with precautions not to displace the Mirena IUD.  The speculum is removed. We go to the abdomen and infiltrate to the subcutaneous tissue with Marcaine one quarter plane. We make a 1.5 cm incision with the scalpel at the supraumbilical area. We opened the aponeurosis with Mayo scissors under direct vision. The parietal peritoneum is opened bluntly with the finger. We applied a pursestring stitch of Vicryl 0 on the aponeurosis. The Sheryle Hail is inserted at that level and a pneumoperitoneum is created. The camera was inserted in that port.  We made a second incision in the left pelvic area over 5 mm with the scalpel after infiltration of Marcaine one quarter plain.  A 5 mm trocar was inserted under direct vision at that level.  With an atraumatic clamp, we inspected the abdominal and pelvic cavities.  The uterus is normal in size and appearance, overall normal volume and no evidence of fibroids. Both tubes are status post tubal sterilization with no other pathology. Both ovaries are normal in size and appearance with no cysts or other pathology.  No lesion of endometriosis and no adhesion were present in the pelvic cavity.  The appendix was normal to inspection. The liver was normal to inspection.   No evidence of bowel disease.  Hemostasis was adequate at all levels.  The instrument was therefore removed.  The port was removed under direct vision.  The camera and the [facility] were removed and the CO2 was evacuated.  The aponeurosis was closed by attaching the pursestring stitch. We closed both skin incisions with a subcuticular stitch of Monocryl 4-0.  Dermabond was added on both incisions and a dressing.  The instrument was removed from the vagina and we verified the position of the Mirena IUD which was good. Silver nitrate was used at the site of the tenaculum to control hemostasis.  The Foley was removed from the bladder. The patient was brought to recovery room in good and stable status.  ESTIMATED BLOOD LOSS:  15 CC   Intake/Output Summary (Last 24 hours) at 12/27/14 1053 Last data filed at 12/27/14 1040  Gross per 24 hour  Intake   1000 ml  Output    150 ml  Net    850 ml     BLOOD ADMINISTERED:none   LOCAL MEDICATIONS USED:  MARCAINE     SPECIMEN:  No Specimen  DISPOSITION OF SPECIMEN:  N/A  COUNTS:  YES  PLAN OF CARE: Transfer to PACU  Princess Bruins MD  12/27/2014 at 10:53 am

## 2014-12-27 NOTE — H&P (Signed)
Allison Guzman is an 44 y.o. female G41P2A2  RP:  Persistent Rt pelvic pain/menometro  Pertinent Gynecological History: Menses: Menometro improved on Mirena IUD/Camila Contraception: IUD and tubal ligation Blood transfusions: none Sexually transmitted diseases: no past history Previous GYN Procedures: C/S, BT/S  Last mammogram: normal  Last pap: normal  OB History: G4P2A2   Menstrual History:  No LMP recorded. Patient is not currently having periods (Reason: IUD).    Past Medical History  Diagnosis Date  . Arthritis   . Anemia   . Thyroid disease   . Multiple sclerosis   . Cancer   . Thyroid cancer     2010  . Movement disorder   . Vision abnormalities     Past Surgical History  Procedure Laterality Date  . Joint replacement    . Thyroidectomy    . Patella reconstruction      Family History  Problem Relation Age of Onset  . Cancer Maternal Grandmother   . Cancer Maternal Grandfather   . Heart disease Paternal Grandmother   . Heart disease Paternal Grandfather   . Healthy Mother   . Thyroid cancer Father   . Healthy Brother   . Healthy Sister   . Healthy Sister     Social History:  reports that she quit smoking about 11 years ago. She has never used smokeless tobacco. She reports that she does not drink alcohol or use illicit drugs.  Allergies: No Known Allergies  Prescriptions prior to admission  Medication Sig Dispense Refill Last Dose  . Cholecalciferol (VITAMIN D3) 10000 UNITS capsule Take 10,000 Units by mouth daily.     . citalopram (CELEXA) 20 MG tablet Take 20 mg by mouth daily.   1 Past Week at Unknown time  . Cyanocobalamin (B-12 PO) Take 1 tablet by mouth daily.     Marland Kitchen levothyroxine (SYNTHROID, LEVOTHROID) 125 MCG tablet Take 125 mcg by mouth daily before breakfast.   4 Past Week at Unknown time  . omeprazole (PRILOSEC) 40 MG capsule Take 40 mg by mouth daily.   11 Past Month at Unknown time  . tamsulosin (FLOMAX) 0.4 MG CAPS capsule One pill  daily by mouth for bladder hesitancy (Patient taking differently: Take 0.4 mg by mouth daily. One pill daily by mouth for bladder hesitancy) 30 capsule 11 Past Week at Unknown time  . tiZANidine (ZANAFLEX) 4 MG capsule Take 4 mg by mouth 3 (three) times daily.   5 Past Week at Unknown time  . valACYclovir (VALTREX) 500 MG tablet Take 500 mg by mouth 2 (two) times daily.   3 Past Week at Unknown time  . amphetamine-dextroamphetamine (ADDERALL) 15 MG tablet Take 1 tablet by mouth 2 (two) times daily. 60 tablet 0   . Dextroamphetamine Sulfate 15 MG TABS Take 15 mg by mouth 3 (three) times daily. 90 tablet 0   . etodolac (LODINE) 400 MG tablet Take 400 mg by mouth daily as needed for moderate pain (MS).   5 Taking  . ibuprofen (ADVIL,MOTRIN) 800 MG tablet Take 800 mg by mouth every 6 (six) hours as needed for moderate pain.   0 Taking  . methylPREDNIsolone (MEDROL DOSPACK) 4 MG tablet follow package directions (Patient taking differently: Take 4 mg by mouth as directed. follow package directions) 21 tablet 1     ROS  Blood pressure 141/81, pulse 74, temperature 98.8 F (37.1 C), temperature source Oral, resp. rate 16, height 5\' 1"  (1.549 m), weight 250 lb (113.399 kg), SpO2 100 %.  Physical Exam   Pelvic US neg  No results found for this or any previous visit (from the past 24 hour(s)).  No results found.  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  Surgery and risks reviewed.  Ashton Sabine,MARIE-LYNE 12/27/2014, 9:10 AM

## 2014-12-30 ENCOUNTER — Encounter (HOSPITAL_COMMUNITY): Payer: Self-pay | Admitting: Obstetrics & Gynecology

## 2015-01-24 ENCOUNTER — Telehealth: Payer: Self-pay | Admitting: Neurology

## 2015-01-24 NOTE — Telephone Encounter (Signed)
Patient ( is having surgery this morning and is requesting to speak with Faith RN. Regarding some questioning she has regarding the surgery and what she should expect.She has requested to speak with someone before 10:00am because that is when she is going in for the surgery. Please call and advise. (

## 2015-01-27 NOTE — Telephone Encounter (Signed)
[place].  I was out of the office on Friday and returned this morning to see this message./fim

## 2015-01-28 NOTE — Telephone Encounter (Signed)
LMTC./fim 

## 2015-01-28 NOTE — Telephone Encounter (Signed)
Patient called back and would like return call [phone]. Thanks!

## 2015-01-28 NOTE — Telephone Encounter (Signed)
I have spoken with Allison Guzman this morning and expressed regret that she did not receive a call back last Friday.  Dr. Felecia Shelling and I were both out of the office that day.  I did try to call her Monday morning when I returned to the office.  She sts. she had surgery to repair a torn left miniscus on Friday-was just calling to make sure it was ok to have the surgery.  At any rate--she sts. the procedure went well.  She has normal p/o pain, but is f/u with ortho for this.  She has a routine f/u with RAS this Thursday, so will discuss further then if needed/fim

## 2015-01-30 ENCOUNTER — Ambulatory Visit: Payer: BLUE CROSS/BLUE SHIELD | Admitting: Neurology

## 2015-02-13 ENCOUNTER — Telehealth: Payer: Self-pay | Admitting: Neurology

## 2015-02-13 MED ORDER — METHYLPREDNISOLONE 4 MG PO TBPK
ORAL_TABLET | ORAL | Status: DC
Start: 2015-02-13 — End: 2015-03-20

## 2015-02-13 MED ORDER — INDOMETHACIN 25 MG PO CAPS: 25.0000 mg | ORAL_CAPSULE | Freq: Three times a day (TID) | ORAL | Status: DC | PRN

## 2015-02-13 NOTE — Telephone Encounter (Signed)
Patient called and stated that she has been having head aches for a week and is also experiencing back spasms, she has requested to speak with Faith RN. Please call and advise.

## 2015-02-13 NOTE — Telephone Encounter (Signed)
I have spoken with Allison Guzman this afternoon.  She c/o intermittent h/a, lbp onset one week ago.  She is alert, oriented times 4, with speech clear and deliberate on the phone.  [name] is appropriate.  Per RAS, I offered Indomethacin 25mg  po tid prn, and a Medrol dose pk.  She is agreeable with this plan . Rx's escribed to [facility] per her request.  She will call me Monday if sx persist and she needs an appt/fim

## 2015-02-17 ENCOUNTER — Ambulatory Visit: Payer: Self-pay | Admitting: Neurology

## 2015-02-20 ENCOUNTER — Other Ambulatory Visit: Payer: Self-pay | Admitting: Neurology

## 2015-02-20 ENCOUNTER — Telehealth: Payer: Self-pay | Admitting: Neurology

## 2015-02-20 NOTE — Telephone Encounter (Signed)
Per previous encounter by [facility]

## 2015-02-20 NOTE — Telephone Encounter (Signed)
Allison Guzman with [facility] called stating they received a urinalysis tube and no test ordered for it. She is inquiring if it was extra or if anything needed to be ran. She can be reached at [phone] x [phone].

## 2015-02-20 NOTE — Telephone Encounter (Signed)
LMTC./fim 

## 2015-02-20 NOTE — Telephone Encounter (Signed)
I have spoken with Lynita this afternoon and per RAS advised Krisanne just needs a routine u/a.  She sts. she will enter lab order--"I'll get this put in for you."/fim

## 2015-03-01 ENCOUNTER — Other Ambulatory Visit: Payer: Self-pay | Admitting: Neurology

## 2015-03-03 ENCOUNTER — Telehealth: Payer: Self-pay | Admitting: Neurology

## 2015-03-03 NOTE — Telephone Encounter (Signed)
LMOM that I have completed fmla paperwork and faxed it back to [phone] as requested/fim

## 2015-03-03 NOTE — Telephone Encounter (Signed)
Patient called and requested to speak with Faith RN regarding some FMLA paperwork she faxed over. Please call and advise.

## 2015-03-03 NOTE — Telephone Encounter (Signed)
I have spoken with Allison Guzman this morning.  I confirmed that I did receive fmla paperwork and will complete them asap.  She would like paperwork faxed back to her at [phone] when it is complete/fim

## 2015-03-04 ENCOUNTER — Encounter: Payer: Self-pay | Admitting: Neurology

## 2015-03-09 ENCOUNTER — Other Ambulatory Visit: Payer: Self-pay | Admitting: Neurology

## 2015-03-19 ENCOUNTER — Telehealth: Payer: Self-pay | Admitting: Neurology

## 2015-03-19 NOTE — Telephone Encounter (Signed)
Patient called stating Dr Felecia Shelling had discussed her having a neuropathy last OV. She is inquiring if he could prescribe a medication as the symptoms have gotten worse. Please call and advise. Patient can be reached at [phone] or [phone]. I did not see a dx for neuropathy.

## 2015-03-19 NOTE — Telephone Encounter (Signed)
I have spoken with Allison Guzman this afternoon--she was last seen in Jan. so it is time for f/u anyway--appt. to discuss burning in feet given for tomorrow at 0840/fim

## 2015-03-20 ENCOUNTER — Other Ambulatory Visit: Payer: Self-pay | Admitting: Neurology

## 2015-03-20 ENCOUNTER — Encounter: Payer: Self-pay | Admitting: Neurology

## 2015-03-20 ENCOUNTER — Ambulatory Visit (INDEPENDENT_AMBULATORY_CARE_PROVIDER_SITE_OTHER): Payer: BLUE CROSS/BLUE SHIELD | Admitting: Neurology

## 2015-03-20 VITALS — BP 132/86 | HR 78 | Resp 16 | Ht 61.0 in | Wt 250.0 lb

## 2015-03-20 DIAGNOSIS — R3911 Hesitancy of micturition: Secondary | ICD-10-CM | POA: Diagnosis not present

## 2015-03-20 DIAGNOSIS — R5382 Chronic fatigue, unspecified: Secondary | ICD-10-CM

## 2015-03-20 DIAGNOSIS — Z79899 Other long term (current) drug therapy: Secondary | ICD-10-CM

## 2015-03-20 DIAGNOSIS — R269 Unspecified abnormalities of gait and mobility: Secondary | ICD-10-CM

## 2015-03-20 DIAGNOSIS — R208 Other disturbances of skin sensation: Secondary | ICD-10-CM | POA: Diagnosis not present

## 2015-03-20 DIAGNOSIS — G35 Multiple sclerosis: Secondary | ICD-10-CM

## 2015-03-20 DIAGNOSIS — G2581 Restless legs syndrome: Secondary | ICD-10-CM | POA: Insufficient documentation

## 2015-03-20 MED ORDER — TOLTERODINE TARTRATE ER 4 MG PO CP24: 4.0000 mg | ORAL_CAPSULE | Freq: Every day | ORAL | Status: DC

## 2015-03-20 MED ORDER — GABAPENTIN 300 MG PO CAPS: ORAL_CAPSULE | ORAL | Status: DC

## 2015-03-20 MED ORDER — AMPHETAMINE-DEXTROAMPHET ER 30 MG PO CP24: 30.0000 mg | ORAL_CAPSULE | Freq: Every day | ORAL | Status: DC

## 2015-03-20 NOTE — Progress Notes (Signed)
[facility]  PATIENT: Allison Guzman DOB: [birth year]  REFERRING CLINICIAN:  Heather Spry HISTORY FROM:  patient REASON FOR VISIT:  Multiple sclerosis.   HISTORICAL  CHIEF COMPLAINT:  Chief Complaint  Patient presents with  . Multiple Sclerosis    2nd yr. Lemtrada infusions due in December.  1st yr. infusions were 08-19-14 thru 08-24-15.  Sts. she isn't sure she wants to continue with Lemtrada bcause she hasn't felt significantly better.  Today she has new c/o burning sensation bottoms of feet, onset about 6 mos. ago.  She also c/o feeling "like I'm riding a bike all night" with leg movements.  Sts. lbp is also worse--radiating down posterior aspect of right leg to knee.  Tens unit helps./fim  . Burning Sensation Feet  . Restless Legs    HISTORY OF PRESENT ILLNESS:   Allison Guzman is a 44 year old woman with a lapse remitting MS who had a course of [name] treatment December 2016. Currently, she is reporting several bothersome symptoms including numbness in the bottoms of the feet, restless leg syndromes, back and right leg pain.  Sensation:   She reports burning in her feet, right > left.   The burning is constant.   The dysesthesia is only in the feet.   Her hands 'fall asleep' intermittently.      LBP/leg pain:   She has LBP that radiates into the right leg and up the spine towards the upper back.   Pain is mostly in the buttock and into the right thigh.      RLS:   She starts to to get an uncomfortable feeling in her legs and moving legs makes her feels better for a few seconds and then it comes back.   It is all throughout the night, even when she moves legs.   This started at the beginning of the year.  It seemed to get worse after knee surgery in May  MS History:   She was diagnosed with relapsing remitting MS in 2004 after presenting with diplopia and vertigo. She had MRI/CSF c/w MS.   She had a five-day course of IV steroids.    Dr. Brett Fairy placed her on  Rebif.  She had a couple of exacerbations while on Rebif with some effect on her gait.   She trandferred care to me.  She was switched to Copaxone plus estriol. Additionally, CellCept was tried for short while.  Unfortunately, she continued to have breakthrough exacerbations. Around 2008, she started Tysabri. She did fairly well on Tysabri with one definite exacerbation in 2012.    She was also JCV antibody positive. Therefore, she opted to switch to [place].   However, she continued to have exacerbations and disease activity on MRI and she switched to Tecfidera.   For about one year she was on Tecfidera but had a lot of GI side effects with nausea. Additionally she had breakthrough disease. December 2015 she received Lemtrada at 12 mg per day for 5 days. She tolerated the infusion fairly well with no significant systemic side effects or allergic reactions.   We had previously discussed the risk of autoimmune disease with Lemtrada. She is post operative with thyroidectomy due to thyroid cancer.   A little tissue remains but is being suppressed.   She is on supplementation.  Gait/strength:   Gait is stable neurologically but seems worse due to LBP.    Her right foot sometimes drags.    She is able to walk without a cane but  is off balance so uses a cane  . She also has weakness in the right leg greater than the left leg and sometimes she has a tremor in that leg. There is spasticity in the legs. Tizanidine has helped the spasticity some but it makes her sleepy, so she takes only som bedtimes and some mornings.   Baclofen did not help her spasticity is much.   Vision:  She notes resolution of the diplopia.  This was present for many years and is worse when she is more tired.  Bladder:  She has some hesitancy helped by tamsulosin.  However, the frequency is worse.   She no longer uses Depends.   Several years ago, she was on either Detrol or Ditropan with mild benefit.    Fatigue:   She has fatigue and  hypersomnolence and falls asleep very easily. Dextroamphetamine or Adderall helped her some and she takes usually only on a week day.   She usually sleeps fairly well at night. She gets about 4-6 hours nightly.   Mood:   She has had some difficulty with depression. She is currently on citalopram with some benefit. On that medicine she notes less anxiety and irritability.       REVIEW OF SYSTEMS:  Constitutional: No fevers, chills, sweats, or change in appetite Eyes:  She notes double vision.   No eye pain Ear, nose and throat: No hearing loss, ear pain, nasal congestion, sore throat Cardiovascular: No chest pain, palpitations Respiratory:  No shortness of breath at rest or with exertion.   No wheezes GastrointestinaI: No nausea, vomiting, diarrhea, abdominal pain, fecal incontinence Genitourinary:  see above. Musculoskeletal:  No neck pain, back pain Integumentary: No rash, pruritus, skin lesions Neurological: as above Psychiatric: No depression at this time.  No anxiety Endocrine: No palpitations, diaphoresis, change in appetite, change in weigh or increased thirst Hematologic/Lymphatic:  No anemia, purpura, petechiae. Allergic/Immunologic: No itchy/runny eyes, nasal congestion, recent allergic reactions, rashes  ALLERGIES: No Known Allergies  HOME MEDICATIONS: Outpatient Prescriptions Prior to Visit  Medication Sig Dispense Refill  . Cholecalciferol (VITAMIN D3) 10000 UNITS capsule Take 10,000 Units by mouth daily.    . citalopram (CELEXA) 20 MG tablet Take 20 mg by mouth daily.   1  . Cyanocobalamin (B-12 PO) Take 1 tablet by mouth daily.    Marland Kitchen etodolac (LODINE) 400 MG tablet Take 400 mg by mouth daily as needed for moderate pain (MS).   5  . ibuprofen (ADVIL,MOTRIN) 800 MG tablet Take 800 mg by mouth every 6 (six) hours as needed for moderate pain.   0  . levothyroxine (SYNTHROID, LEVOTHROID) 125 MCG tablet Take 125 mcg by mouth daily before breakfast.   4  . omeprazole  (PRILOSEC) 40 MG capsule Take 40 mg by mouth daily.   11  . oxyCODONE-acetaminophen (PERCOCET) 7.5-325 MG per tablet Take 1 tablet by mouth every 4 (four) hours as needed for severe pain. 30 tablet 0  . tiZANidine (ZANAFLEX) 4 MG capsule Take 4 mg by mouth 3 (three) times daily.   5  . amphetamine-dextroamphetamine (ADDERALL) 15 MG tablet Take 1 tablet by mouth 2 (two) times daily. (Patient not taking: Reported on 03/20/2015) 60 tablet 0  . Dextroamphetamine Sulfate 15 MG TABS Take 15 mg by mouth 3 (three) times daily. (Patient not taking: Reported on 03/20/2015) 90 tablet 0  . indomethacin (INDOCIN) 25 MG capsule TAKE 1 CAPSULE(25 MG) BY MOUTH THREE TIMES DAILY AS NEEDED 30 capsule 1  . methylPREDNISolone (MEDROL  DOSEPAK) 4 MG TBPK tablet Take as directed (Patient not taking: Reported on 03/20/2015) 21 tablet 0  . tamsulosin (FLOMAX) 0.4 MG CAPS capsule One pill daily by mouth for bladder hesitancy (Patient not taking: Reported on 03/20/2015) 30 capsule 11  . valACYclovir (VALTREX) 500 MG tablet Take 500 mg by mouth 2 (two) times daily.   3   No facility-administered medications prior to visit.    PAST MEDICAL HISTORY: Past Medical History  Diagnosis Date  . Arthritis   . Anemia   . Thyroid disease   . Multiple sclerosis   . Cancer   . Thyroid cancer     2010  . Movement disorder   . Vision abnormalities     PAST SURGICAL HISTORY: Past Surgical History  Procedure Laterality Date  . Joint replacement    . Thyroidectomy    . Patella reconstruction    . Laparoscopy N/A 12/27/2014    Procedure: LAPAROSCOPY DIAGNOSTIC ;  Surgeon: Princess Bruins, MD;  Location: [facility];  Service: Gynecology;  Laterality: N/A;  . Iud removal N/A 12/27/2014    Procedure: INTRAUTERINE DEVICE (IUD) REMOVAL;  Surgeon: Princess Bruins, MD;  Location: [facility];  Service: Gynecology;  Laterality: N/A;    FAMILY HISTORY: Family History  Problem Relation Age of Onset  . Cancer Maternal Grandmother   . Cancer  Maternal Grandfather   . Heart disease Paternal Grandmother   . Heart disease Paternal Grandfather   . Healthy Mother   . Thyroid cancer Father   . Healthy Brother   . Healthy Sister   . Healthy Sister     SOCIAL HISTORY:  History   Social History  . Marital Status: Married    Spouse Name: N/A  . Number of Children: N/A  . Years of Education: N/A   Occupational History  . Not on file.   Social History Main Topics  . Smoking status: Former Smoker    Quit date: 10/02/2003  . Smokeless tobacco: Never Used  . Alcohol Use: No  . Drug Use: No  . Sexual Activity: Yes    Birth Control/ Protection: Surgical   Other Topics Concern  . Not on file   Social History Narrative     PHYSICAL EXAM  Filed Vitals:   03/20/15 0842  BP: 132/86  Pulse: 78  Resp: 16  Height: 5\' 1"  (1.549 m)  Weight: 250 lb (113.399 kg)    Body mass index is 47.26 kg/(m^2).   General: The patient is well-developed and well-nourished and in no acute distress  Skin: Extremities are without significant edema.  Neurologic Exam  Mental status: The patient is alert and oriented x 3 at the time of the examination. The patient has apparent normal recent and remote memory, with an apparently normal attention span and concentration ability.   Speech is normal.  Cranial nerves: Extraocular movements are full. Pupils are equal, round, and reactive to light and accomodation.  Visual fields are full.  Facial symmetry is present. There is good facial sensation to soft touch bilaterally.Facial strength is normal.  Trapezius and sternocleidomastoid strength is normal. No dysarthria is noted.  The tongue is midline, and the patient has symmetric elevation of the soft palate. No obvious hearing deficits are noted.  Motor:  Muscle bulk is norma but tome is increased in leg.  Strength is  5 / 5 in the arms and 4/5 right leg and 4+/5 in left leg.   Sensory: Sensory testing shows allodynia in LFCN distribution on  the right.    Coordination: Cerebellar testing reveals slightly reduced finger-nose-finger and poor heel -to-shin bilaterally.  Gait and station: Station is norma and gait is wide    She cannot tandem. Romberg is positive.   Reflexes: Deep tendon reflexes are symmetric and brisk bilaterally. Nonsustained clonus in right leg.  Plantar responses are normal.    DIAGNOSTIC DATA (LABS, IMAGING, TESTING) - I reviewed patient records, labs, notes, testing and imaging myself where available.    ASSESSMENT AND PLAN  Multiple sclerosis - Plan: Ferritin, MR Brain W Wo Contrast  Abnormality of gait - Plan: MR Brain W Wo Contrast  High risk medication use - Plan: Ferritin  Chronic fatigue  Urinary hesitancy  Dysesthesia - Plan: MR Brain W Wo Contrast  Restless leg - Plan: Ferritin   1.   She will continue in the monitoring program for Lemtrada. We discussed that her next dose will be in December. She will receive 12 mg daily 3 days at that time. 2.  For her bladder symptoms, I will add Detrol. She should stop the Detrol if she finds her hesitancy significantly worsens. 3.  I'll renew her Adderall and switch her to ask our formulation. 4.  For her restless legs, lateral femoral cutaneous neuropathy and dysesthesias, I will add gabapentin 300-300-600. I will also check a ferritin to make sure that she is not iron deficient at that can worsen restless legs. 5.  She will return to see me in 4 months or sooner if she has new or worsening neurologic symptoms.   Jaileen Janelle A. Felecia Shelling, MD, PhD 01/15/6567, 1:27 AM Certified in Neurology, Clinical Neurophysiology, Sleep Medicine, Pain Medicine and Neuroimaging  [facility] [address], [place], [place] [address] [phone]

## 2015-03-21 ENCOUNTER — Telehealth: Payer: Self-pay | Admitting: *Deleted

## 2015-03-21 LAB — FERRITIN: FERRITIN: 65 ng/mL (ref 15–150)

## 2015-03-21 NOTE — Telephone Encounter (Signed)
I tried to reach [facility] on her cell # but vm is full.  I lmom on husband's cell (identified vm) that per RAS, iron level is normal, so Allison Guzman does not need to take iron supplements--should take Gabapentin as rx'd and let us know if it is not helping after couple of weeks.  They do not need to return this call unless they have questions/fim

## 2015-03-21 NOTE — Telephone Encounter (Signed)
-----   Message from Britt Bottom, MD sent at 03/21/2015  8:37 AM EDT ----- Please let her know that the ferritin (iron) level was in the normal range.   Therefore, she does not have to take iron supplements. Hopefully the gabapentin will get the restless legs under control. If not better in a couple weeks let us know.

## 2015-05-02 ENCOUNTER — Telehealth: Payer: Self-pay

## 2015-05-02 ENCOUNTER — Other Ambulatory Visit: Payer: Self-pay | Admitting: Neurology

## 2015-05-02 MED ORDER — AMPHETAMINE-DEXTROAMPHET ER 30 MG PO CP24
30.0000 mg | ORAL_CAPSULE | Freq: Every day | ORAL | Status: DC
Start: 2015-05-02 — End: 2015-07-01

## 2015-05-02 NOTE — Telephone Encounter (Signed)
Rx ready for pick up. 

## 2015-05-02 NOTE — Telephone Encounter (Signed)
Dr Felecia Shelling is out of the office.  Request forwarded to [facility] for review.

## 2015-05-02 NOTE — Telephone Encounter (Signed)
Patient called requesting refill for amphetamine-dextroamphetamine (ADDERALL XR) 30 MG 24 hr capsule . Patient advised RX will be ready within 24 hrs unless otherwise informed by RN. Patient advised Dr Felecia Shelling is out of office until Monday

## 2015-05-22 ENCOUNTER — Telehealth: Payer: Self-pay | Admitting: *Deleted

## 2015-05-22 NOTE — Telephone Encounter (Signed)
LMTC--Dr. Felecia Shelling has another pt. considering Lemtrada, and she would like to speak with someone who has already had infusions--Dr. Felecia Shelling would like to know if Allison Guzman would be interested in speaking with this other pt./fim

## 2015-05-28 NOTE — Telephone Encounter (Signed)
LMTC./fim 

## 2015-05-30 ENCOUNTER — Other Ambulatory Visit: Payer: Self-pay | Admitting: *Deleted

## 2015-05-30 MED ORDER — CITALOPRAM HYDROBROMIDE 20 MG PO TABS: 20.0000 mg | ORAL_TABLET | Freq: Every day | ORAL | Status: DC

## 2015-05-30 MED ORDER — TIZANIDINE HCL 4 MG PO CAPS: 4.0000 mg | ORAL_CAPSULE | Freq: Three times a day (TID) | ORAL | Status: DC

## 2015-05-30 NOTE — Telephone Encounter (Signed)
Received a call from [facility] this morning.  Tizanidine an Celexa r/f per request/fim

## 2015-05-30 NOTE — Telephone Encounter (Signed)
I have spoken with Allison Guzman this morning.  She sts. she will be happy to speak with another potential Lemtrada pt. regarding these infusions from a pt. perspective.  I have given the other pt's contact info (per other pt's request).  Allison Guzman sts. she will call this other pt. this weekend and I  have let other pt. know to expect a call this weekend/fim

## 2015-06-02 NOTE — Telephone Encounter (Signed)
VM is full.  If she calls back, the # she needs is [phone]/fim

## 2015-06-02 NOTE — Telephone Encounter (Signed)
Patient called to advise she misplaced the phone number Faith gave her regarding Lemtrada. Patient needs the phone number again.

## 2015-06-03 ENCOUNTER — Telehealth: Payer: Self-pay | Admitting: Neurology

## 2015-06-03 NOTE — Telephone Encounter (Signed)
I have spoken with Allison Guzman this morning and gave her the requested information/fim

## 2015-06-03 NOTE — Telephone Encounter (Signed)
LMTC./fim 

## 2015-06-03 NOTE — Telephone Encounter (Signed)
Patient called back regarding phone number she misplaced. Phone number given to patient. Patient can't remember the person's name that she is supposed to ask for.

## 2015-06-03 NOTE — Telephone Encounter (Signed)
I have spoken with Allison Guzman this morning and advised that she has an appt. with RAS on 07-24-15 at 0840, and Lemtrada infusions will be planned, scheduled at that visit.  I anticipate that infuisons will be the 2nd or 3rd week of December.  She verbalized understanding of same/fim

## 2015-06-03 NOTE — Telephone Encounter (Signed)
Patient called to inquire when does Dr. Felecia Shelling want to do the 2nd dose of Lemtrada in December. She needs to know so she can take the time off from work. Please call patient 1) Wk# [phone] ext [phone] or 2) Cell# [phone].

## 2015-06-04 ENCOUNTER — Ambulatory Visit (INDEPENDENT_AMBULATORY_CARE_PROVIDER_SITE_OTHER): Payer: BLUE CROSS/BLUE SHIELD

## 2015-06-04 DIAGNOSIS — R269 Unspecified abnormalities of gait and mobility: Secondary | ICD-10-CM | POA: Diagnosis not present

## 2015-06-04 DIAGNOSIS — R208 Other disturbances of skin sensation: Secondary | ICD-10-CM | POA: Diagnosis not present

## 2015-06-04 DIAGNOSIS — G35 Multiple sclerosis: Secondary | ICD-10-CM | POA: Diagnosis not present

## 2015-06-04 MED ORDER — GADOPENTETATE DIMEGLUMINE 469.01 MG/ML IV SOLN: 20.0000 mL | Freq: Once | INTRAVENOUS | Status: DC | PRN

## 2015-06-09 ENCOUNTER — Telehealth: Payer: Self-pay | Admitting: Neurology

## 2015-06-09 NOTE — Telephone Encounter (Signed)
Patient is calling to get the results of her MRI test.  Please call([phone]).

## 2015-06-10 ENCOUNTER — Telehealth: Payer: Self-pay | Admitting: Neurology

## 2015-06-10 MED ORDER — ROPINIROLE HCL 0.5 MG PO TABS: ORAL_TABLET | ORAL | Status: DC

## 2015-06-10 NOTE — Telephone Encounter (Signed)
I spoke to show anemia about her MRI. She has 3 punctate enhancing foci in the periventricular and deep white matter c/w small newer lesions.       Clinically, there are no exacerbations.      She will return to see me in 6 week.   We'll plan on another Lemtrada infusion but I will discuss further with her and we could also consider another medication (such as Zynbryta, ocrelizumab or reconsider Tysabri (though she is high titer jCV Ab).   Her MS is very aggressive and other agents may not be as helpful as Lemtrada.  RLS is bothersome.   Gabapentin helped but caused weight gain.   I'lll call in requip

## 2015-06-10 NOTE — Telephone Encounter (Signed)
Dr. Felecia Shelling will call pt. to discuss mri brain results, as there were changes/fim

## 2015-06-11 NOTE — Telephone Encounter (Signed)
I have spoken with Allison Guzman.  She reports more of a decline in vision, sts. needs stronger glasses.  No c/o eye pain.  Sts. sometimes has some double vision if she looks far to the side.  Will check with RAS and call her back/fim

## 2015-06-11 NOTE — Telephone Encounter (Signed)
Pt can be reached at work ph number [phone] ext [phone]

## 2015-06-11 NOTE — Telephone Encounter (Signed)
Patient called and said that she spoke with Dr. Felecia Shelling yesterday and asked if any new problems. Patient stated that her vision is rapidly changing. Wonders if new lesions are on or in the area of her optic nerves.

## 2015-06-11 NOTE — Telephone Encounter (Signed)
Per RAS, if Allison Guzman feels this is a more recent problem, he would like for her to have 2 days of Solumedrol 1gm IV.  If she feels this is more of a chronic problem, he would like for her to f/u with  her opthalmologist.  Allison Guzman sts. she feels problem started 1-2 mos. ago.  She is agreeable to trying IV SM to see if this helps.  She will come in at 0830 tomorrow am for day 1.  Orders given to [facility] in the infusion suite/fim

## 2015-06-13 ENCOUNTER — Telehealth: Payer: Self-pay | Admitting: *Deleted

## 2015-06-13 NOTE — Telephone Encounter (Signed)
[place], need to speak with her regarding lab results.  Per RAS, TSH is very high (54 and normal is 0.450-4.5).  He would like to know if she is still seeing an endocrinologist.  If she is I will forward these lab results to him, if she is not, will make referral/fim

## 2015-06-17 ENCOUNTER — Telehealth: Payer: Self-pay | Admitting: *Deleted

## 2015-06-17 NOTE — Telephone Encounter (Signed)
-----   Message from Britt Bottom, MD sent at 06/03/2015  8:50 AM EDT ----- Please let her know labs look good except thyroid labs (TSH is high implying hypothyroid) --- is she still seeing endocrinology?   If they have not checked labs we can forward last labs

## 2015-06-17 NOTE — Telephone Encounter (Signed)
I have spoken with Allison Guzman and advised that TSH was high.  She sts. she still sees her endocrinologist, Dr. Roxy Cedar, for yearly checks.  I have faxed labs to Dr. Quay Burow at [facility] fax # [phone], with fax confirmation received/fim

## 2015-07-01 ENCOUNTER — Other Ambulatory Visit: Payer: Self-pay | Admitting: Neurology

## 2015-07-01 MED ORDER — AMPHETAMINE-DEXTROAMPHET ER 30 MG PO CP24
30.0000 mg | ORAL_CAPSULE | Freq: Every day | ORAL | Status: DC
Start: 2015-07-01 — End: 2015-07-24

## 2015-07-01 NOTE — Telephone Encounter (Signed)
Pt called requesting refill for amphetamine-dextroamphetamine (ADDERALL XR) 30 MG 24 hr capsule . Pt advised RX will be ready within 24 hours unless otherwise informed by RN

## 2015-07-02 ENCOUNTER — Encounter: Payer: Self-pay | Admitting: *Deleted

## 2015-07-02 NOTE — Progress Notes (Signed)
Adderall rx. up front [facility]/fim 

## 2015-07-17 ENCOUNTER — Telehealth: Payer: Self-pay | Admitting: Neurology

## 2015-07-17 NOTE — Telephone Encounter (Signed)
I have spoken with Allison Guzman and advised extra tube should be for CD4.  [facility] Lemtrada requisition form faxed to her at [phone]

## 2015-07-17 NOTE — Telephone Encounter (Signed)
[facility] [phone] option 1, ext [phone] called regarding lab results. They received an extra sample, a yellow top sample and needs to know if that is needed for extra testing.

## 2015-07-24 ENCOUNTER — Encounter: Payer: Self-pay | Admitting: Neurology

## 2015-07-24 ENCOUNTER — Ambulatory Visit (INDEPENDENT_AMBULATORY_CARE_PROVIDER_SITE_OTHER): Payer: BLUE CROSS/BLUE SHIELD | Admitting: Neurology

## 2015-07-24 VITALS — BP 116/78 | HR 74 | Resp 16 | Ht 61.0 in | Wt 254.6 lb

## 2015-07-24 DIAGNOSIS — Z79899 Other long term (current) drug therapy: Secondary | ICD-10-CM | POA: Diagnosis not present

## 2015-07-24 DIAGNOSIS — R208 Other disturbances of skin sensation: Secondary | ICD-10-CM | POA: Diagnosis not present

## 2015-07-24 DIAGNOSIS — F4323 Adjustment disorder with mixed anxiety and depressed mood: Secondary | ICD-10-CM | POA: Diagnosis not present

## 2015-07-24 DIAGNOSIS — R5382 Chronic fatigue, unspecified: Secondary | ICD-10-CM | POA: Diagnosis not present

## 2015-07-24 DIAGNOSIS — G2581 Restless legs syndrome: Secondary | ICD-10-CM

## 2015-07-24 DIAGNOSIS — G35 Multiple sclerosis: Secondary | ICD-10-CM

## 2015-07-24 DIAGNOSIS — R3911 Hesitancy of micturition: Secondary | ICD-10-CM

## 2015-07-24 DIAGNOSIS — R269 Unspecified abnormalities of gait and mobility: Secondary | ICD-10-CM

## 2015-07-24 MED ORDER — AMPHETAMINE-DEXTROAMPHET ER 30 MG PO CP24: 30.0000 mg | ORAL_CAPSULE | Freq: Every day | ORAL | Status: DC

## 2015-07-24 NOTE — Progress Notes (Signed)
[facility]  PATIENT: Allison Guzman DOB: 1971/08/21  REFERRING CLINICIAN:  Heather Spry HISTORY FROM:  patient REASON FOR VISIT:  Multiple sclerosis.   HISTORICAL  CHIEF COMPLAINT:  Chief Complaint  Patient presents with  . Multiple Sclerosis    Here to schedule 2nd year of Lemtrada infusions.  1st yr. infusions were Dec. 03-23-2014.  She denies new or worsening sx.  Sts. she continues to have intermittent dizziness/fim    HISTORY OF PRESENT ILLNESS:   Allison Guzman is a 44 year old woman with a lapse remitting MS who had a course of [name] treatment December 2016. She tolerated it very well with no infusion reactions.   She has been participating well in the [place] program and labwork has been ok.   In September, she had an MRI of the brain for surveillance and she did have a little bit of breakthrough disease with 3 small enhancing lesions. She received 3 days of IV steroids. She never had any symptoms  LBP/leg pain:   The LBP that radiated into the right leg and up the spine towards the upper back improved on ibuprofen.        RLS:   She has a fair amount of restless leg symptoms at night time in bed.      She describes an uncomfortable feeling in her legs and moving legs makes her feels better for a few seconds and then it comes back.   It is all throughout the night, even when she moves legs.   Gabapentin helped but caused her to be sleepy and to hallucinate.   Requip caused confusion and she stopped.      Gait/strength/sensation:   Gait is stable, and possibly mildly better than last year.  However, her knees are bad and she may need surgery.   Her knees affect gait more than MS.   Her right foot sometimes drags.    She is able to walk without a cane but is off balance so uses a cane  She  has mild weakness in the right leg  There is spasticity in the legs. Tizanidine has helped the spasticity some but it makes her sleepy, so she takes only som bedtimes  and some mornings.   Baclofen did not help her spasticity.    She reports mild burning in her feet, right > left, better than last visit.     Vision:  She has diplopia only when she looks to an extreme with head also turned.  Visual acuity is fine.  Bladder:  She has some hesitancy helped by tamsulosin.  However,  frequency into helped much by Detrol.    She no longer uses Depends.   Detrol dries her mouth  Fatigue:   She has fatigue and hypersomnolence and falls asleep very easily. Dextroamphetamine or Adderall help and she takes usually only on a work day.   She sleeps fairly well at night. She gets about 5-6 hours nightly but wakes up x 3 to urinate.   She snores but husband has not noted gasping or snorting  Mood:   She has had some depression but is better on citalopram.   No anxiety.  Cognition:    She denies any significant difficulty with cognition. She works full time.  MS History:   She was diagnosed with relapsing remitting MS in 2004 after presenting with diplopia and vertigo. She had MRI/CSF c/w MS.   She had a five-day course of IV steroids.  Dr. Brett Fairy placed her on Rebif.  She had a couple of exacerbations while on Rebif with some effect on her gait.   She trandferred care to me.  She was switched to Copaxone plus estriol. Additionally, CellCept was tried for short while.  Unfortunately, she continued to have breakthrough exacerbations. Around 2008, she started Tysabri. She did fairly well on Tysabri with one definite exacerbation in 2012.    She was also JCV antibody positive. Therefore, she opted to switch to [place].   However, she continued to have exacerbations and disease activity on MRI and she switched to Tecfidera.   For about one year she was on Tecfidera but had a lot of GI side effects with nausea. Additionally she had breakthrough disease. December 2015 she received Lemtrada at 12 mg per day for 5 days. She tolerated the infusion fairly well with no significant systemic  side effects or allergic reactions.   We had previously discussed the risk of autoimmune disease with Lemtrada. She is post operative with thyroidectomy due to thyroid cancer.   A little tissue remains but is being suppressed.   She is on supplementation.   MRI 06/05/15 showed 3 small enhancing lesions and she received several days IV steroid at that time.      REVIEW OF SYSTEMS:  Constitutional: No fevers, chills, sweats, or change in appetite.  She has fatigue and hypersomnia Eyes:  She notes double vision.   No eye pain Ear, nose and throat: No hearing loss, ear pain, nasal congestion, sore throat Cardiovascular: No chest pain, palpitations Respiratory:  No shortness of breath at rest or with exertion.   No wheezes GastrointestinaI: No nausea, vomiting, diarrhea, abdominal pain, fecal incontinence Genitourinary:  see above. Musculoskeletal:  No neck pain, back pain Integumentary: No rash, pruritus, skin lesions Neurological: as above Psychiatric: No depression at this time (some in past)  No anxiety Endocrine: No palpitations, diaphoresis, change in appetite, change in weigh or increased thirst Hematologic/Lymphatic:  No anemia, purpura, petechiae. Allergic/Immunologic: No itchy/runny eyes, nasal congestion, recent allergic reactions, rashes  ALLERGIES: No Known Allergies  HOME MEDICATIONS: Outpatient Prescriptions Prior to Visit  Medication Sig Dispense Refill  . amphetamine-dextroamphetamine (ADDERALL XR) 30 MG 24 hr capsule Take 1 capsule (30 mg total) by mouth daily. 30 capsule 0  . Cholecalciferol (VITAMIN D3) 10000 UNITS capsule Take 10,000 Units by mouth daily.    . citalopram (CELEXA) 20 MG tablet Take 1 tablet (20 mg total) by mouth daily. 30 tablet 5  . Cyanocobalamin (B-12 PO) Take 1 tablet by mouth daily.    Marland Kitchen etodolac (LODINE) 400 MG tablet Take 400 mg by mouth daily as needed for moderate pain (MS).   5  . ibuprofen (ADVIL,MOTRIN) 800 MG tablet Take 800 mg by mouth  every 6 (six) hours as needed for moderate pain.   0  . levothyroxine (SYNTHROID, LEVOTHROID) 125 MCG tablet Take 125 mcg by mouth daily before breakfast.   4  . omeprazole (PRILOSEC) 40 MG capsule Take 40 mg by mouth daily.   11  . tamsulosin (FLOMAX) 0.4 MG CAPS capsule One pill daily by mouth for bladder hesitancy 30 capsule 11  . tiZANidine (ZANAFLEX) 4 MG capsule Take 1 capsule (4 mg total) by mouth 3 (three) times daily. 90 capsule 3  . tolterodine (DETROL LA) 4 MG 24 hr capsule Take 1 capsule (4 mg total) by mouth daily. 30 capsule 5  . gabapentin (NEURONTIN) 300 MG capsule One po in am; one po in  evening and two po at bedtime (Patient not taking: Reported on 07/24/2015) 120 capsule 11  . indomethacin (INDOCIN) 25 MG capsule TAKE 1 CAPSULE(25 MG) BY MOUTH THREE TIMES DAILY AS NEEDED (Patient not taking: Reported on 07/24/2015) 30 capsule 1  . indomethacin (INDOCIN) 25 MG capsule TAKE 1 CAPSULE(25 MG) BY MOUTH THREE TIMES DAILY AS NEEDED (Patient not taking: Reported on 07/24/2015) 30 capsule 3  . rOPINIRole (REQUIP) 0.5 MG tablet Take one or two at bedtime (Patient not taking: Reported on 07/24/2015) 60 tablet 11  . valACYclovir (VALTREX) 500 MG tablet Take 500 mg by mouth 2 (two) times daily.   3  . oxyCODONE-acetaminophen (PERCOCET) 7.5-325 MG per tablet Take 1 tablet by mouth every 4 (four) hours as needed for severe pain. (Patient not taking: Reported on 07/24/2015) 30 tablet 0   Facility-Administered Medications Prior to Visit  Medication Dose Route Frequency Provider Last Rate Last Dose  . gadopentetate dimeglumine (MAGNEVIST) injection 20 mL  20 mL Intravenous Once PRN Britt Bottom, MD        PAST MEDICAL HISTORY: Past Medical History  Diagnosis Date  . Arthritis   . Anemia   . Thyroid disease   . Multiple sclerosis ([place])   . Cancer ([place])   . Thyroid cancer ([place])     2010  . Movement disorder   . Vision abnormalities     PAST SURGICAL HISTORY: Past Surgical History    Procedure Laterality Date  . Joint replacement    . Thyroidectomy    . Patella reconstruction    . Laparoscopy N/A 12/27/2014    Procedure: LAPAROSCOPY DIAGNOSTIC ;  Surgeon: Princess Bruins, MD;  Location: [facility];  Service: Gynecology;  Laterality: N/A;  . Iud removal N/A 12/27/2014    Procedure: INTRAUTERINE DEVICE (IUD) REMOVAL;  Surgeon: Princess Bruins, MD;  Location: [facility];  Service: Gynecology;  Laterality: N/A;    FAMILY HISTORY: Family History  Problem Relation Age of Onset  . Cancer Maternal Grandmother   . Cancer Maternal Grandfather   . Heart disease Paternal Grandmother   . Heart disease Paternal Grandfather   . Healthy Mother   . Thyroid cancer Father   . Healthy Brother   . Healthy Sister   . Healthy Sister     SOCIAL HISTORY:  Social History   Social History  . Marital Status: Married    Spouse Name: N/A  . Number of Children: N/A  . Years of Education: N/A   Occupational History  . Not on file.   Social History Main Topics  . Smoking status: Former Smoker    Quit date: 10/02/2003  . Smokeless tobacco: Never Used  . Alcohol Use: No  . Drug Use: No  . Sexual Activity: Yes    Birth Control/ Protection: Surgical   Other Topics Concern  . Not on file   Social History Narrative     PHYSICAL EXAM  Filed Vitals:   07/24/15 0831  BP: 116/78  Pulse: 74  Resp: 16  Height: 5\' 1"  (1.549 m)  Weight: 254 lb 9.6 oz (115.486 kg)    Body mass index is 48.13 kg/(m^2).   General: The patient is well-developed and well-nourished and in no acute distress  Skin: Extremities are without significant edema.  Neurologic Exam  Mental status: The patient is alert and oriented x 3 at the time of the examination. The patient has apparent normal recent and remote memory, with an apparently normal attention span and concentration ability.  Speech is normal.  Cranial nerves: Extraocular movements are full. Pupils are equal, round, and reactive to light  and accomodation.  Visual fields are full.  Facial symmetry is present. There is good facial sensation to soft touch bilaterally.Facial strength is normal.  Trapezius and sternocleidomastoid strength is normal. No dysarthria is noted.  The tongue is midline, and the patient has symmetric elevation of the soft palate. No obvious hearing deficits are noted.  Motor:  Muscle bulk is normal.  Tone is increased in right leg.  Strength is  5 / 5 in the arms and 4/5 right leg and 4+/5 in left leg.   Sensory: Sensory testing shows allodynia in LFCN distribution on the right.    She notes mildly reduced sensation to temperature and vibration in right arm more than leg.    Coordination: Cerebellar testing reveals slightly reduced finger-nose-finger and poor heel -to-shin bilaterally.  Gait and station: Station is norma and gait is wide    She cannot tandem. Romberg is positive.   Reflexes: Deep tendon reflexes are symmetric and brisk bilaterally. 2-3 beats clonusclonus in right leg.  Plantar responses are normal.    DIAGNOSTIC DATA (LABS, IMAGING, TESTING) - I reviewed patient records, labs, notes, testing and imaging myself where available.    ASSESSMENT AND PLAN  Multiple sclerosis (HCC)  Abnormality of gait  High risk medication use  Adjustment disorder with mixed anxiety and depressed mood  Chronic fatigue  Urinary hesitancy  Dysesthesia  Restless leg    1.   Her next dose of Lemtrada (12 mg IV daily x 3d) will be in December.     She will continue in the monitoring program for Lemtrada.  2.  Continue Detrol and tamsulosin for bladder. 3.  I'll renew her Adderall . 4.  If RLS worsens, consider low dose nighttime benzo or opiate as can't tolerate dopamine agonist or gabapentin.   5.  She will return to see me in 4 months or sooner if she has new or worsening neurologic symptoms.   Nekeshia Lenhardt A. Felecia Shelling, MD, PhD [id number], [id number] AM Certified in Neurology, Clinical Neurophysiology,  Sleep Medicine, Pain Medicine and Neuroimaging  [facility] [address], [place], [place] [address] [phone]

## 2015-08-20 ENCOUNTER — Encounter: Payer: Self-pay | Admitting: *Deleted

## 2015-08-20 NOTE — Progress Notes (Signed)
Faxed Lemtrada [facility] pt auth and baseline form. Received confirmation. Sent copy to MR.

## 2015-08-26 ENCOUNTER — Other Ambulatory Visit: Payer: Self-pay | Admitting: *Deleted

## 2015-08-26 MED ORDER — VALACYCLOVIR HCL 500 MG PO TABS: 500.0000 mg | ORAL_TABLET | Freq: Two times a day (BID) | ORAL | Status: DC

## 2015-08-28 ENCOUNTER — Other Ambulatory Visit: Payer: Self-pay | Admitting: *Deleted

## 2015-08-28 MED ORDER — AMPHETAMINE-DEXTROAMPHET ER 30 MG PO CP24: 30.0000 mg | ORAL_CAPSULE | Freq: Every day | ORAL | Status: DC

## 2015-08-28 NOTE — Telephone Encounter (Signed)
Adderall rx. provided at infusion appt/fim

## 2015-09-02 ENCOUNTER — Encounter: Payer: Self-pay | Admitting: Neurology

## 2015-09-23 ENCOUNTER — Telehealth: Payer: Self-pay | Admitting: *Deleted

## 2015-09-23 NOTE — Telephone Encounter (Signed)
-----   Message from Britt Bottom, MD sent at 09/23/2015 10:57 AM EST ----- Regarding: labs Please let her know that the last round of labs look good as far as the MS perspective. Hovwever,  TSH was elevated which implies that her thyroid hormone may be low.   Would she like [place] to fax the labs to her endocrinologist (please get name and number if she has it)

## 2015-09-23 NOTE — Telephone Encounter (Signed)
LMTC./fim 

## 2015-09-23 NOTE — Telephone Encounter (Signed)
I have spoken with Allison Guzman this afternoon and per RAS, advised that TSH was elevated.  She sts. she is aware of this; her endocrinologist, Dr. Dora Sims in [facility] is aware and is treating her./fim

## 2015-09-28 ENCOUNTER — Other Ambulatory Visit: Payer: Self-pay | Admitting: Neurology

## 2015-10-03 DIAGNOSIS — M1710 Unilateral primary osteoarthritis, unspecified knee: Secondary | ICD-10-CM | POA: Insufficient documentation

## 2015-10-03 DIAGNOSIS — E669 Obesity, unspecified: Secondary | ICD-10-CM | POA: Insufficient documentation

## 2015-10-03 DIAGNOSIS — M179 Osteoarthritis of knee, unspecified: Secondary | ICD-10-CM | POA: Insufficient documentation

## 2015-10-03 DIAGNOSIS — M1711 Unilateral primary osteoarthritis, right knee: Secondary | ICD-10-CM | POA: Insufficient documentation

## 2015-10-28 DIAGNOSIS — M23329 Other meniscus derangements, posterior horn of medial meniscus, unspecified knee: Secondary | ICD-10-CM | POA: Insufficient documentation

## 2015-10-28 DIAGNOSIS — M23359 Other meniscus derangements, posterior horn of lateral meniscus, unspecified knee: Secondary | ICD-10-CM | POA: Insufficient documentation

## 2015-10-29 ENCOUNTER — Telehealth: Payer: Self-pay | Admitting: Neurology

## 2015-10-29 MED ORDER — TIZANIDINE HCL 4 MG PO CAPS: 4.0000 mg | ORAL_CAPSULE | Freq: Three times a day (TID) | ORAL | Status: DC

## 2015-10-29 NOTE — Telephone Encounter (Signed)
Patient also advised she has noticed bruising unrelated to surgery 10/23/15.

## 2015-10-29 NOTE — Telephone Encounter (Signed)
Patient called to request that Dr. Felecia Shelling put her back on tiZANidine (ZANAFLEX) 4 MG capsule

## 2015-10-29 NOTE — Telephone Encounter (Signed)
PC with Allison Guzman.  She c/o increased spasticity in legs over the last 1-2 weeks, since right knee surgery (repaired miniscus).  She is also concerned about one bruise on the back of her hand onset yesterday.  I have provided reassurance that spasticity in surgical leg will prob seem worse due to p/o pain/edema.  She verbalized understanding of same, does not seem reassured, so appt. given to discuss.  Tizanidine escribed/fim

## 2015-10-30 ENCOUNTER — Encounter: Payer: Self-pay | Admitting: Neurology

## 2015-10-30 ENCOUNTER — Ambulatory Visit (INDEPENDENT_AMBULATORY_CARE_PROVIDER_SITE_OTHER): Payer: BLUE CROSS/BLUE SHIELD | Admitting: Neurology

## 2015-10-30 VITALS — BP 130/86 | HR 68 | Resp 18 | Ht 61.0 in | Wt 260.2 lb

## 2015-10-30 DIAGNOSIS — G35 Multiple sclerosis: Secondary | ICD-10-CM | POA: Diagnosis not present

## 2015-10-30 DIAGNOSIS — F4323 Adjustment disorder with mixed anxiety and depressed mood: Secondary | ICD-10-CM | POA: Diagnosis not present

## 2015-10-30 DIAGNOSIS — R5382 Chronic fatigue, unspecified: Secondary | ICD-10-CM | POA: Diagnosis not present

## 2015-10-30 DIAGNOSIS — R3911 Hesitancy of micturition: Secondary | ICD-10-CM | POA: Diagnosis not present

## 2015-10-30 DIAGNOSIS — Z79899 Other long term (current) drug therapy: Secondary | ICD-10-CM | POA: Diagnosis not present

## 2015-10-30 DIAGNOSIS — R208 Other disturbances of skin sensation: Secondary | ICD-10-CM | POA: Diagnosis not present

## 2015-10-30 DIAGNOSIS — E890 Postprocedural hypothyroidism: Secondary | ICD-10-CM

## 2015-10-30 DIAGNOSIS — R269 Unspecified abnormalities of gait and mobility: Secondary | ICD-10-CM

## 2015-10-30 MED ORDER — DEXTROAMPHETAMINE SULFATE 15 MG PO TABS: ORAL_TABLET | ORAL | Status: DC

## 2015-10-30 MED ORDER — TIZANIDINE HCL 4 MG PO CAPS: 4.0000 mg | ORAL_CAPSULE | Freq: Four times a day (QID) | ORAL | Status: DC

## 2015-10-30 MED ORDER — CYCLOBENZAPRINE HCL 5 MG PO TABS: 5.0000 mg | ORAL_TABLET | Freq: Every day | ORAL | Status: DC

## 2015-10-30 NOTE — Progress Notes (Signed)
[facility]  PATIENT: Allison Guzman DOB: May 21, 1971  REFERRING CLINICIAN:  Heather Spry HISTORY FROM:  patient REASON FOR VISIT:  Multiple sclerosis.   HISTORICAL  CHIEF COMPLAINT:  Chief Complaint  Patient presents with  . Multiple Sclerosis    Lemtrada 2nd yr. infusions were 9-12, 13, 14 of 2016.  Recent right knee surgery (repaired meniscus).  on 10-23-15.  She is ambulatory with a cane today--doing well but feels spasticity in legs is worse since the surgery.  More painful spasms in right leg from anterior mid thigh to ant. mid tib/fib area.  She also has one bruise on the back of her right hand that she can't account for and is concerned about./fim    HISTORY OF PRESENT ILLNESS:   Allison Guzman is a 45 year old woman with a lapse remitting MS who had her second year course of Lemtrada treatment December 2016. She tolerated it very well with no infusion reactions.   She has been participating well in the [place] program and labwork has been ok.   In September, she had an MRI of the brain for surveillance and she did have a little bit of breakthrough disease with 3 small enhancing lesions. She received 3 days of IV steroids. She never had any symptoms  LBP/leg pain:   The LBP that radiated into the right leg and up the spine towards the upper back improved on ibuprofen.        RLS:   She has a fair amount of restless leg symptoms at night time in bed.      She describes an uncomfortable feeling in her legs and moving legs makes her feels better for a few seconds and then it comes back.   It is all throughout the night, even when she moves legs.   Gabapentin helped but caused her to be sleepy and to hallucinate.   Requip caused confusion and she stopped.      Gait/strength/sensation:   She feels her gait is probably stable from [place] perspective but worse due to knee..    She had recent right knee surgery and is improving daily.   She feels her knees affect gait  more than MS.   Her right foot sometimes drags.    Prior to surgery, she was walking without a cane inside the house.   There is spasticity in both legs. Tizanidine has helped the spasticity some but it makes her sleepy some days.    Baclofen did not help her spasticity.    She reports mild burning in her feet, right > left, better than last visit.     Vision:  She has diplopia if she looks to an extreme with head also turned.  Visual acuity is fine.  Bladder:  She has some hesitancy helped by tamsulosin.  Frequency helped much by Detrol.    She no longer uses Depends.   Detrol dries her mouth  Fatigue:   She has fatigue and hypersomnolence. Dextroamphetamine helps better than Adderall and she takes usually only on a work day.   She sleeps poorly now, waking up 3-4 times nightly and taking a while to fall back asleep.   She gets about 5-6 hours nightly but wakes up x 3 to urinate.   She snores but husband has not noted gasping or snorting   She has RLS.    Mood:   She has had some depression but is stable on citalopram. No crying spells.   No anxiety.  Cognition:    She denies any significant difficulty with cognition. She works full time.  Thyroid:   She sees [facility]) and has h/o thyroidectomy for papillary carcinoma.   She is on Synthroid.   Dose recently increased  MS History:   She was diagnosed with relapsing remitting MS in 2004 after presenting with diplopia and vertigo. She had MRI/CSF c/w MS.   She had a five-day course of IV steroids.    Dr. Brett Fairy placed her on Rebif.  She had a couple of exacerbations while on Rebif with some effect on her gait.   She trandferred care to me.  She was switched to Copaxone plus estriol. Additionally, CellCept was tried for short while.  Unfortunately, she continued to have breakthrough exacerbations. Around 2008, she started Tysabri. She did fairly well on Tysabri with one definite exacerbation in 2012.    She was also JCV  antibody positive. Therefore, she opted to switch to [place].   However, she continued to have exacerbations and disease activity on MRI and she switched to Tecfidera.   For about one year she was on Tecfidera but had a lot of GI side effects with nausea. Additionally she had breakthrough disease. December 2015 she received Lemtrada at 12 mg per day for 5 days. She tolerated the infusion fairly well with no significant systemic side effects or allergic reactions.   We had previously discussed the risk of autoimmune disease with Lemtrada. She is post operative with thyroidectomy due to thyroid cancer.   A little tissue remains but is being suppressed.   She is on supplementation.   MRI 06/05/15 showed 3 small enhancing lesions and she received several days IV steroid at that time.   She hd her second course of Lemtrada in December 2016 x 3 days.    REVIEW OF SYSTEMS:  Constitutional: No fevers, chills, sweats, or change in appetite.  She has fatigue and hypersomnia Eyes:  She notes double vision.   No eye pain Ear, nose and throat: No hearing loss, ear pain, nasal congestion, sore throat Cardiovascular: No chest pain, palpitations Respiratory:  No shortness of breath at rest or with exertion.   No wheezes GastrointestinaI: No nausea, vomiting, diarrhea, abdominal pain, fecal incontinence Genitourinary:  see above. Musculoskeletal:  No neck pain, back pain Integumentary: No rash, pruritus, skin lesions Neurological: as above Psychiatric: No depression at this time (some in past)  No anxiety Endocrine: No palpitations, diaphoresis, change in appetite, change in weigh or increased thirst Hematologic/Lymphatic:  No anemia, purpura, petechiae. Allergic/Immunologic: No itchy/runny eyes, nasal congestion, recent allergic reactions, rashes  ALLERGIES: No Known Allergies  HOME MEDICATIONS: Outpatient Prescriptions Prior to Visit  Medication Sig Dispense Refill  . amphetamine-dextroamphetamine  (ADDERALL XR) 30 MG 24 hr capsule Take 1 capsule (30 mg total) by mouth daily. 30 capsule 0  . Cholecalciferol (VITAMIN D3) 10000 UNITS capsule Take 10,000 Units by mouth daily.    . citalopram (CELEXA) 20 MG tablet Take 1 tablet (20 mg total) by mouth daily. 30 tablet 5  . Cyanocobalamin (B-12 PO) Take 1 tablet by mouth daily.    Marland Kitchen ibuprofen (ADVIL,MOTRIN) 800 MG tablet Take 800 mg by mouth every 6 (six) hours as needed for moderate pain.   0  . omeprazole (PRILOSEC) 40 MG capsule Take 40 mg by mouth daily.   11  . tamsulosin (FLOMAX) 0.4 MG CAPS capsule One pill daily by mouth for bladder hesitancy 30 capsule 11  . tiZANidine (ZANAFLEX) 4  MG capsule Take 1 capsule (4 mg total) by mouth 3 (three) times daily. 90 capsule 3  . valACYclovir (VALTREX) 500 MG tablet Take 1 tablet (500 mg total) by mouth 2 (two) times daily. 60 tablet 2  . gabapentin (NEURONTIN) 300 MG capsule One po in am; one po in evening and two po at bedtime (Patient not taking: Reported on 07/24/2015) 120 capsule 11  . etodolac (LODINE) 400 MG tablet Take 400 mg by mouth daily as needed for moderate pain (MS). Reported on 10/30/2015  5  . indomethacin (INDOCIN) 25 MG capsule TAKE 1 CAPSULE(25 MG) BY MOUTH THREE TIMES DAILY AS NEEDED (Patient not taking: Reported on 07/24/2015) 30 capsule 1  . indomethacin (INDOCIN) 25 MG capsule TAKE 1 CAPSULE(25 MG) BY MOUTH THREE TIMES DAILY AS NEEDED (Patient not taking: Reported on 07/24/2015) 30 capsule 3  . levothyroxine (SYNTHROID, LEVOTHROID) 125 MCG tablet Take 125 mcg by mouth daily before breakfast. Reported on 10/30/2015  4  . rOPINIRole (REQUIP) 0.5 MG tablet Take one or two at bedtime (Patient not taking: Reported on 07/24/2015) 60 tablet 11  . tolterodine (DETROL LA) 4 MG 24 hr capsule TAKE 1 CAPSULE(4 MG) BY MOUTH DAILY 30 capsule 1   Facility-Administered Medications Prior to Visit  Medication Dose Route Frequency Provider Last Rate Last Dose  . gadopentetate dimeglumine  (MAGNEVIST) injection 20 mL  20 mL Intravenous Once PRN Britt Bottom, MD        PAST MEDICAL HISTORY: Past Medical History  Diagnosis Date  . Arthritis   . Anemia   . Thyroid disease   . Multiple sclerosis ([place])   . Cancer ([place])   . Thyroid cancer ([place])     2010  . Movement disorder   . Vision abnormalities     PAST SURGICAL HISTORY: Past Surgical History  Procedure Laterality Date  . Joint replacement    . Thyroidectomy    . Patella reconstruction    . Laparoscopy N/A 12/27/2014    Procedure: LAPAROSCOPY DIAGNOSTIC ;  Surgeon: Princess Bruins, MD;  Location: [facility];  Service: Gynecology;  Laterality: N/A;  . Iud removal N/A 12/27/2014    Procedure: INTRAUTERINE DEVICE (IUD) REMOVAL;  Surgeon: Princess Bruins, MD;  Location: [facility];  Service: Gynecology;  Laterality: N/A;    FAMILY HISTORY: Family History  Problem Relation Age of Onset  . Cancer Maternal Grandmother   . Cancer Maternal Grandfather   . Heart disease Paternal Grandmother   . Heart disease Paternal Grandfather   . Healthy Mother   . Thyroid cancer Father   . Healthy Brother   . Healthy Sister   . Healthy Sister     SOCIAL HISTORY:  Social History   Social History  . Marital Status: Married    Spouse Name: N/A  . Number of Children: N/A  . Years of Education: N/A   Occupational History  . Not on file.   Social History Main Topics  . Smoking status: Former Smoker    Quit date: 10/02/2003  . Smokeless tobacco: Never Used  . Alcohol Use: No  . Drug Use: No  . Sexual Activity: Yes    Birth Control/ Protection: Surgical   Other Topics Concern  . Not on file   Social History Narrative     PHYSICAL EXAM  Filed Vitals:   10/30/15 0944  BP: 130/86  Pulse: 68  Resp: 18  Height: 5\' 1"  (1.549 m)  Weight: 260 lb 3.2 oz (118.026 kg)  Body mass index is 49.19 kg/(m^2).   General: The patient is well-developed and well-nourished and in no acute distress  Skin: Extremities are  without significant edema.  Neurologic Exam  Mental status: The patient is alert and oriented x 3 at the time of the examination. The patient has apparent normal recent and remote memory, with an apparently normal attention span and concentration ability.   Speech is normal.  Cranial nerves: Extraocular movements are full.  There is good facial sensation to soft touch bilaterally.Facial strength is normal.  Trapezius and sternocleidomastoid strength is normal. No dysarthria is noted.  The tongue is midline, and the patient has symmetric elevation of the soft palate. No obvious hearing deficits are noted.  Motor:  Muscle bulk is normal.  Tone is increased in right leg.  Strength is  5 / 5 in the arms and 4/5 right leg and 4+/5 in left leg.   Sensory: Sensory testing shows allodynia in LFCN distribution on the right.    She notes mildly reduced sensation to temperature and vibration in right arm and left  leg.    Coordination: Cerebellar testing shows slightly reduced finger-nose-finger.   Did not test HTS as post-op.   and poor heel -to-shin bilaterally.  Gait and station: Station is mildly wide and gait is wide and difficult (recent knee surgery, though)   She cannot tandem. Romberg is positive.   Reflexes: Deep tendon reflexes are symmetric and brisk bilaterally. Did not test right leg as post-op recently.      DIAGNOSTIC DATA (LABS, IMAGING, TESTING) - I reviewed patient records, labs, notes, testing and imaging myself where available.    ASSESSMENT AND PLAN  Multiple sclerosis (HCC)  Abnormality of gait  Dysesthesia  High risk medication use    1.    She seems stable after second [name] thpugh has several unexplained bruises --- check CBC/platelets.     She will continue in the monitoring program for Lemtrada.    We need to check MRI brain in about 4 months (around time of next visit) to monitor for subclinical progression.   If present, consider repeat dose of Lemtrada or  other agent.   2.  Continue Detrol and tamsulosin for bladder. 3.  I'll change Adderall to Dexedrine 15 mg po tid 4.  Flexeril for sleep maintenance.   Consider benzo if not better (also has RLS)   5.  She will return to see me in 4 months or sooner if she has new or worsening neurologic symptoms.   Camyra Vaeth A. Felecia Shelling, MD, PhD [id number], [id number] AM Certified in Neurology, Clinical Neurophysiology, Sleep Medicine, Pain Medicine and Neuroimaging  [facility] [address] [place], [place] [address] [phone]

## 2015-10-31 LAB — CBC WITH DIFFERENTIAL/PLATELET
BASOS: 0 %
Basophils Absolute: 0 10*3/uL (ref 0.0–0.2)
EOS (ABSOLUTE): 0.1 10*3/uL (ref 0.0–0.4)
Eos: 2 %
Hematocrit: 37.6 % (ref 34.0–46.6)
Hemoglobin: 11.9 g/dL (ref 11.1–15.9)
IMMATURE GRANS (ABS): 0 10*3/uL (ref 0.0–0.1)
IMMATURE GRANULOCYTES: 1 %
LYMPHS: 7 %
Lymphocytes Absolute: 0.4 10*3/uL — ABNORMAL LOW (ref 0.7–3.1)
MCH: 27.5 pg (ref 26.6–33.0)
MCHC: 31.6 g/dL (ref 31.5–35.7)
MCV: 87 fL (ref 79–97)
MONOCYTES: 11 %
Monocytes Absolute: 0.6 10*3/uL (ref 0.1–0.9)
NEUTROS PCT: 79 %
Neutrophils Absolute: 4.6 10*3/uL (ref 1.4–7.0)
PLATELETS: 287 10*3/uL (ref 150–379)
RBC: 4.33 x10E6/uL (ref 3.77–5.28)
RDW: 14.6 % (ref 12.3–15.4)
WBC: 5.8 10*3/uL (ref 3.4–10.8)

## 2015-11-04 ENCOUNTER — Telehealth: Payer: Self-pay | Admitting: *Deleted

## 2015-11-04 NOTE — Telephone Encounter (Signed)
-----   Message from Britt Bottom, MD sent at 10/31/2015  2:24 PM EST ----- Please note that the platelet counts were fine.

## 2015-11-04 NOTE — Telephone Encounter (Signed)
I have spoken with Allia this morning and per RAS, advised that platelet counts are fine.  She verbalized understanding of same/fim

## 2015-11-21 ENCOUNTER — Other Ambulatory Visit: Payer: Self-pay | Admitting: Neurology

## 2015-11-21 ENCOUNTER — Ambulatory Visit: Payer: BLUE CROSS/BLUE SHIELD | Admitting: Neurology

## 2015-12-01 ENCOUNTER — Encounter: Payer: Self-pay | Admitting: Neurology

## 2015-12-01 ENCOUNTER — Other Ambulatory Visit: Payer: Self-pay | Admitting: Neurology

## 2015-12-02 ENCOUNTER — Encounter: Payer: Self-pay | Admitting: Neurology

## 2015-12-03 ENCOUNTER — Telehealth: Payer: Self-pay | Admitting: *Deleted

## 2015-12-03 NOTE — Telephone Encounter (Signed)
[place].  She emailed with c/o increased spasticity.  i have spoken with RAS and have a couple of options for her/fim

## 2015-12-04 ENCOUNTER — Telehealth: Payer: Self-pay | Admitting: Neurology

## 2015-12-04 ENCOUNTER — Other Ambulatory Visit: Payer: Self-pay | Admitting: Neurology

## 2015-12-04 MED ORDER — DEXTROAMPHETAMINE SULFATE 15 MG PO TABS: ORAL_TABLET | ORAL | Status: DC

## 2015-12-04 NOTE — Telephone Encounter (Signed)
Dexedrine requires pa--quantity limit exception.  I have spoken with Nira Conn at [facility] and she will fax QLE form/fim

## 2015-12-04 NOTE — Telephone Encounter (Signed)
Pt called inquiring if refill for Dextroamphetamine Sulfate 15 MG TABS was received from [facility] on [address]. She has been out for 1 week. Pharmacy said it was faxed yesterday

## 2015-12-04 NOTE — Telephone Encounter (Signed)
Rx. awaiting RAS sig/fim 

## 2015-12-08 NOTE — Telephone Encounter (Signed)
QLE form signed by RAS and I have faxed it back to [facility] fax # [phone]

## 2015-12-09 ENCOUNTER — Ambulatory Visit (INDEPENDENT_AMBULATORY_CARE_PROVIDER_SITE_OTHER): Payer: BLUE CROSS/BLUE SHIELD | Admitting: Neurology

## 2015-12-09 ENCOUNTER — Encounter: Payer: Self-pay | Admitting: Neurology

## 2015-12-09 ENCOUNTER — Telehealth: Payer: Self-pay | Admitting: Neurology

## 2015-12-09 VITALS — BP 110/70 | HR 74 | Resp 18 | Ht 61.0 in | Wt 254.5 lb

## 2015-12-09 DIAGNOSIS — R208 Other disturbances of skin sensation: Secondary | ICD-10-CM

## 2015-12-09 DIAGNOSIS — R269 Unspecified abnormalities of gait and mobility: Secondary | ICD-10-CM

## 2015-12-09 DIAGNOSIS — R5382 Chronic fatigue, unspecified: Secondary | ICD-10-CM | POA: Diagnosis not present

## 2015-12-09 DIAGNOSIS — R3911 Hesitancy of micturition: Secondary | ICD-10-CM | POA: Diagnosis not present

## 2015-12-09 DIAGNOSIS — M25669 Stiffness of unspecified knee, not elsewhere classified: Secondary | ICD-10-CM | POA: Insufficient documentation

## 2015-12-09 DIAGNOSIS — G2581 Restless legs syndrome: Secondary | ICD-10-CM

## 2015-12-09 DIAGNOSIS — M545 Low back pain, unspecified: Secondary | ICD-10-CM | POA: Insufficient documentation

## 2015-12-09 DIAGNOSIS — G8310 Monoplegia of lower limb affecting unspecified side: Secondary | ICD-10-CM | POA: Insufficient documentation

## 2015-12-09 DIAGNOSIS — G35 Multiple sclerosis: Secondary | ICD-10-CM | POA: Diagnosis not present

## 2015-12-09 DIAGNOSIS — R293 Abnormal posture: Secondary | ICD-10-CM | POA: Insufficient documentation

## 2015-12-09 DIAGNOSIS — M25569 Pain in unspecified knee: Secondary | ICD-10-CM | POA: Insufficient documentation

## 2015-12-09 DIAGNOSIS — Z9889 Other specified postprocedural states: Secondary | ICD-10-CM | POA: Insufficient documentation

## 2015-12-09 MED ORDER — BACLOFEN 10 MG PO TABS: ORAL_TABLET | ORAL | Status: DC

## 2015-12-09 MED ORDER — PHENTERMINE HCL 37.5 MG PO CAPS: 37.5000 mg | ORAL_CAPSULE | ORAL | Status: DC

## 2015-12-09 MED ORDER — CITALOPRAM HYDROBROMIDE 40 MG PO TABS: 40.0000 mg | ORAL_TABLET | Freq: Every day | ORAL | Status: DC

## 2015-12-09 NOTE — Telephone Encounter (Signed)
I spoke with Sianne this morning.  She c/o increased spastiity  entire bilat legs, onset Friday.  Appt. given with RAS this am for eval/fim

## 2015-12-09 NOTE — Progress Notes (Signed)
[facility]  PATIENT: Allison Guzman DOB: 1971/01/29  REFERRING CLINICIAN:  Heather Spry HISTORY FROM:  patient REASON FOR VISIT:  Multiple sclerosis.   HISTORICAL  CHIEF COMPLAINT:  Chief Complaint  Patient presents with  . Multiple Sclerosis    Allison Guzman    HISTORY OF PRESENT ILLNESS:   Allison Guzman is a 45 year old woman with a lapse remitting MS who had her second year course of Lemtrada treatment December 2016. She tolerated it very well with no infusion reactions.   She has been participating well in the [place] program and labwork has been ok.   In September, she had an MRI of the brain for surveillance and she did have a little bit of breakthrough disease with 3 small enhancing lesions. She received 3 days of IV steroids. She never had any symptoms  LBP/leg pain:   The LBP that radiated into the right leg and up the spine towards the upper back improved on ibuprofen.        RLS:   She has a fair amount of restless leg symptoms at night time in bed.      She describes an uncomfortable feeling in her legs and moving legs makes her feels better for a few seconds and then it comes back.   It is all throughout the night, even when she moves legs.   Gabapentin helped but caused her to be sleepy and to hallucinate.   Requip caused confusion and she stopped.      Gait/strength/sensation:   She feels her gait is probably stable from [place] perspective but worse due to knee..    She had recent right knee surgery and is improving daily.   She feels her knees affect gait more than MS.   Her right foot sometimes drags.    Prior to surgery, she was walking without a cane inside the house.   There is spasticity in both legs. Tizanidine has helped the spasticity some but it makes her sleepy some days.    Baclofen made her sleepy but did help her spasticity.    She reports mild burning in her feet, right > left, better than  last visit.  She was once on Ampyra and got some benefit     Vision:  She has diplopia if she looks to an extreme with head also turned.  Visual acuity is fine.  Bladder:  She has some hesitancy helped by tamsulosin.  Frequency helped much by Detrol.    She no longer uses Depends.   Detrol dries her mouth  Fatigue:   She has fatigue and hypersomnolence. Dextroamphetamine helps better than Adderall and she takes usually only on a work day.   She sleeps better with flexeril.  She gets about 5-6 hours nightly but wakes up 1-2 x to urinate.   She snores but husband has not noted gasping or snorting   She has RLS.    Mood/cognition:   She has had some depression.  Celexa helps but she is still moody.. No crying spells.   No anxiety.   She denies any significant difficulty with cognition. She works full time.  Thyroid:   She sees [facility]) and has h/o thyroidectomy for papillary carcinoma.   She is on Synthroid.      MS History:   She was diagnosed with relapsing remitting MS in 2004 after presenting with diplopia and vertigo. She had MRI/CSF c/w MS.  She had a five-day course of IV steroids.    Dr. Brett Fairy placed her on Rebif.  She had a couple of exacerbations while on Rebif with some effect on her gait.   She trandferred care to me.  She was switched to Copaxone plus estriol. Additionally, CellCept was tried for short while.  Unfortunately, she continued to have breakthrough exacerbations. Around 2008, she started Tysabri. She did fairly well on Tysabri with one definite exacerbation in 2012.    She was also JCV antibody positive. Therefore, she opted to switch to Gilenya.   However, she continued to have exacerbations and disease activity on MRI and she switched to Tecfidera.   For about one year she was on Tecfidera but had a lot of GI side effects with nausea. Additionally she had breakthrough disease. December 2015 she received Lemtrada at 12 mg per day for 5 days. She  tolerated the infusion fairly well with no significant systemic side effects or allergic reactions.   We had previously discussed the risk of autoimmune disease with Lemtrada. She is post operative with thyroidectomy due to thyroid cancer.   A little tissue remains but is being suppressed.   She is on supplementation.   MRI 06/05/15 showed 3 small enhancing lesions and she received several days IV steroid at that time.   She hd her second course of Lemtrada in December 2016 x 3 days.    REVIEW OF SYSTEMS:  Constitutional: No fevers, chills, sweats, or change in appetite.  She has fatigue and hypersomnia Eyes:  She notes double vision.   No eye pain Ear, nose and throat: No hearing loss, ear pain, nasal congestion, sore throat Cardiovascular: No chest pain, palpitations Respiratory:  No shortness of breath at rest or with exertion.   No wheezes GastrointestinaI: No nausea, vomiting, diarrhea, abdominal pain, fecal incontinence Genitourinary:  see above. Musculoskeletal:  No neck pain, back pain Integumentary: No rash, pruritus, skin lesions Neurological: as above Psychiatric: No depression at this time (some in past)  No anxiety Endocrine: No palpitations, diaphoresis, change in appetite, change in weigh or increased thirst Hematologic/Lymphatic:  No anemia, purpura, petechiae. Allergic/Immunologic: No itchy/runny eyes, nasal congestion, recent allergic reactions, rashes  ALLERGIES: No Known Allergies  HOME MEDICATIONS: Outpatient Prescriptions Prior to Visit  Medication Sig Dispense Refill  . Alemtuzumab (LEMTRADA) 12 MG/1.2ML SOLN Inject into the vein.    . Cholecalciferol (VITAMIN D3) 10000 UNITS capsule Take 10,000 Units by mouth daily.    . citalopram (CELEXA) 20 MG tablet TAKE 1 TABLET(20 MG) BY MOUTH DAILY 30 tablet 0  . Cyanocobalamin (B-12 PO) Take 1 tablet by mouth daily.    . cyclobenzaprine (FLEXERIL) 5 MG tablet Take 1 tablet (5 mg total) by mouth at bedtime. 30 tablet 11  .  Dextroamphetamine Sulfate 15 MG TABS One po tid 90 tablet 0  . HYDROcodone-acetaminophen (NORCO) 5-325 MG tablet Take by mouth.    Marland Kitchen ibuprofen (ADVIL,MOTRIN) 800 MG tablet Take 800 mg by mouth every 6 (six) hours as needed for moderate pain.   0  . levothyroxine (SYNTHROID, LEVOTHROID) 175 MCG tablet Take 175 mcg by mouth daily before breakfast.    . omeprazole (PRILOSEC) 40 MG capsule Take 40 mg by mouth daily.   11  . tamsulosin (FLOMAX) 0.4 MG CAPS capsule TAKE ONE CAPSULE BY MOUTH EVERY DAY FOR BLADDER HESITANCY 30 capsule 11  . tiZANidine (ZANAFLEX) 4 MG capsule Take 1 capsule (4 mg total) by mouth 4 (four) times daily. 120 capsule  3  . valACYclovir (VALTREX) 500 MG tablet Take 1 tablet (500 mg total) by mouth 2 (two) times daily. 60 tablet 2  . amphetamine-dextroamphetamine (ADDERALL XR) 30 MG 24 hr capsule Take 1 capsule (30 mg total) by mouth daily. 30 capsule 0  . gabapentin (NEURONTIN) 300 MG capsule One po in am; one po in evening and two po at bedtime (Patient not taking: Reported on 07/24/2015) 120 capsule 11   Facility-Administered Medications Prior to Visit  Medication Dose Route Frequency Provider Last Rate Last Dose  . gadopentetate dimeglumine (MAGNEVIST) injection 20 mL  20 mL Intravenous Once PRN Britt Bottom, MD        PAST MEDICAL HISTORY: Past Medical History  Diagnosis Date  . Arthritis   . Anemia   . Thyroid disease   . Multiple sclerosis ([place])   . Cancer ([place])   . Thyroid cancer ([place])     2010  . Movement disorder   . Vision abnormalities     PAST SURGICAL HISTORY: Past Surgical History  Procedure Laterality Date  . Joint replacement    . Thyroidectomy    . Patella reconstruction    . Laparoscopy N/A 12/27/2014    Procedure: LAPAROSCOPY DIAGNOSTIC ;  Surgeon: Princess Bruins, MD;  Location: [facility];  Service: Gynecology;  Laterality: N/A;  . Iud removal N/A 12/27/2014    Procedure: INTRAUTERINE DEVICE (IUD) REMOVAL;  Surgeon: Princess Bruins, MD;   Location: [facility];  Service: Gynecology;  Laterality: N/A;    FAMILY HISTORY: Family History  Problem Relation Age of Onset  . Cancer Maternal Grandmother   . Cancer Maternal Grandfather   . Heart disease Paternal Grandmother   . Heart disease Paternal Grandfather   . Healthy Mother   . Thyroid cancer Father   . Healthy Brother   . Healthy Sister   . Healthy Sister     SOCIAL HISTORY:  Social History   Social History  . Marital Status: Married    Spouse Name: N/A  . Number of Children: N/A  . Years of Education: N/A   Occupational History  . Not on file.   Social History Main Topics  . Smoking status: Former Smoker    Quit date: 10/02/2003  . Smokeless tobacco: Never Used  . Alcohol Use: No  . Drug Use: No  . Sexual Activity: Yes    Birth Control/ Protection: Surgical   Other Topics Concern  . Not on file   Social History Narrative     PHYSICAL EXAM  Filed Vitals:   12/09/15 1039  BP: 110/70  Pulse: 74  Resp: 18  Height: 5\' 1"  (1.549 m)  Weight: 254 lb 8 oz (115.44 kg)    Body mass index is 48.11 kg/(m^2).   General: The patient is well-developed and well-nourished and in no acute distress  Skin: Extremities are without significant edema.  Neurologic Exam  Mental status: The patient is alert and oriented x 3 at the time of the examination. The patient has apparent normal recent and remote memory, with an apparently normal attention span and concentration ability.   Speech is normal.  Cranial nerves: Extraocular movements are full.  There is good facial sensation to soft touch bilaterally.Facial strength is normal.  Trapezius and sternocleidomastoid strength is normal. No dysarthria is noted.  The tongue is midline, and the patient has symmetric elevation of the soft palate. No obvious hearing deficits are noted.  Motor:  Muscle bulk is normal.  Tone is increased in right  leg.  Strength is  5 / 5 in the arms and 4/5 right leg and 4+/5 in left leg.     Sensory: Sensory testing shows allodynia in LFCN distribution on the right.    She notes mildly reduced sensation to temperature and vibration in right arm and left  leg.    Coordination: Cerebellar testing shows slightly reduced finger-nose-finger.  Poor heel -to-shin bilaterally.  Gait and station: Station is mildly wide and gait is wide.   She cannot tandem. Romberg is positive.   Reflexes: Deep tendon reflexes are symmetric and brisk bilaterally.     25 foot timed walk (average of 2 trials) = 17.1 sec     DIAGNOSTIC DATA (LABS, IMAGING, TESTING) - I reviewed patient records, labs, notes, testing and imaging myself where available.    ASSESSMENT AND PLAN  Multiple sclerosis (HCC)  Abnormality of gait  Chronic fatigue  Dysesthesia  Restless leg  Urinary hesitancy    1.   For spasticity, add baclofen 10 milligrams, up to 4 times a day. 2.   Continue monitoring through the [facility] program for Lemtrada 3.  Continue Detrol and tamsulosin for bladder. 4.  Change Dexedrine to Phentermine as insurance won't cover.  5   She will return to see me in 4 months or sooner if she has new or worsening neurologic symptoms.  6.  We discussed reconsidering Ampyra. She will like to hold off at this point but will consider doing this if the baclofen does not help her gait.    Semaja Lymon A. Felecia Shelling, MD, PhD [id number], [id number] AM Certified in Neurology, Clinical Neurophysiology, Sleep Medicine, Pain Medicine and Neuroimaging  [facility] [address], [place], [place] [address] [phone]

## 2015-12-09 NOTE — Telephone Encounter (Signed)
Patient called to request appointment for steroid injection/infusion, states she is "very, very spastic right now".

## 2015-12-10 ENCOUNTER — Encounter: Payer: Self-pay | Admitting: *Deleted

## 2015-12-12 DIAGNOSIS — E559 Vitamin D deficiency, unspecified: Secondary | ICD-10-CM | POA: Insufficient documentation

## 2015-12-18 ENCOUNTER — Telehealth: Payer: Self-pay | Admitting: Neurology

## 2015-12-18 NOTE — Telephone Encounter (Signed)
Spoke with the patient and informed her that the fee needed to be paid and a medical release form needed to be signed. She said she understood and would come by. Will place the paperwork on Faith RN's desk.

## 2015-12-30 ENCOUNTER — Telehealth: Payer: Self-pay | Admitting: *Deleted

## 2015-12-30 ENCOUNTER — Encounter: Payer: Self-pay | Admitting: Neurology

## 2015-12-30 NOTE — Telephone Encounter (Signed)
[place].  I am finished with her FMLA paperwork and only need to know if she wants me to fax this back to her employer, or if she wants to pick it up, or if she wants me to mail it to her./fim

## 2015-12-31 ENCOUNTER — Telehealth: Payer: Self-pay | Admitting: *Deleted

## 2015-12-31 ENCOUNTER — Encounter: Payer: Self-pay | Admitting: *Deleted

## 2015-12-31 NOTE — Telephone Encounter (Signed)
FMLA paperwork completed.  [employer] mailed to pt. per her request.  Copy sent to be scanned into EPIC/fim

## 2016-01-16 ENCOUNTER — Telehealth: Payer: Self-pay | Admitting: *Deleted

## 2016-01-16 NOTE — Telephone Encounter (Signed)
LMOM.  I have  her application for disability parking placard ready.  Just need to know if  she wants me to fax or mail it to her, or does she want to pick it up in the office/fim

## 2016-01-19 ENCOUNTER — Encounter: Payer: Self-pay | Admitting: Neurology

## 2016-01-23 DIAGNOSIS — Z0289 Encounter for other administrative examinations: Secondary | ICD-10-CM

## 2016-02-11 ENCOUNTER — Encounter: Payer: Self-pay | Admitting: Neurology

## 2016-02-12 ENCOUNTER — Encounter: Payer: Self-pay | Admitting: Neurology

## 2016-02-12 ENCOUNTER — Other Ambulatory Visit: Payer: Self-pay | Admitting: Neurology

## 2016-02-13 ENCOUNTER — Other Ambulatory Visit: Payer: Self-pay | Admitting: *Deleted

## 2016-02-13 DIAGNOSIS — Z79899 Other long term (current) drug therapy: Secondary | ICD-10-CM

## 2016-02-13 DIAGNOSIS — G35 Multiple sclerosis: Secondary | ICD-10-CM

## 2016-02-19 ENCOUNTER — Other Ambulatory Visit (INDEPENDENT_AMBULATORY_CARE_PROVIDER_SITE_OTHER): Payer: Self-pay

## 2016-02-19 ENCOUNTER — Telehealth: Payer: Self-pay | Admitting: *Deleted

## 2016-02-19 DIAGNOSIS — Z0289 Encounter for other administrative examinations: Secondary | ICD-10-CM

## 2016-02-19 DIAGNOSIS — G35 Multiple sclerosis: Secondary | ICD-10-CM

## 2016-02-19 DIAGNOSIS — Z79899 Other long term (current) drug therapy: Secondary | ICD-10-CM

## 2016-02-20 LAB — CREATININE, SERUM
CREATININE: 0.67 mg/dL (ref 0.57–1.00)
GFR calc Af Amer: 125 mL/min/{1.73_m2} (ref >59)
GFR, EST NON AFRICAN AMERICAN: 109 mL/min/{1.73_m2} (ref >59)

## 2016-02-20 LAB — BUN: BUN: 12 mg/dL (ref 6–24)

## 2016-02-23 NOTE — Telephone Encounter (Signed)
Timed walk--25 ft. 16.8sec/fim

## 2016-03-02 ENCOUNTER — Encounter: Payer: Self-pay | Admitting: *Deleted

## 2016-03-03 ENCOUNTER — Ambulatory Visit: Payer: BLUE CROSS/BLUE SHIELD | Admitting: Neurology

## 2016-03-05 ENCOUNTER — Encounter: Payer: Self-pay | Admitting: Neurology

## 2016-03-09 ENCOUNTER — Encounter: Payer: Self-pay | Admitting: *Deleted

## 2016-03-17 ENCOUNTER — Encounter: Payer: Self-pay | Admitting: Neurology

## 2016-03-23 ENCOUNTER — Encounter: Payer: Self-pay | Admitting: Neurology

## 2016-04-06 ENCOUNTER — Other Ambulatory Visit: Payer: Self-pay | Admitting: *Deleted

## 2016-04-06 ENCOUNTER — Other Ambulatory Visit: Payer: Self-pay | Admitting: Neurology

## 2016-04-06 MED ORDER — DEXTROAMPHETAMINE SULFATE 15 MG PO TABS: ORAL_TABLET | ORAL | 0 refills | Status: DC

## 2016-04-15 ENCOUNTER — Ambulatory Visit: Payer: Self-pay | Admitting: Neurology

## 2016-05-20 ENCOUNTER — Encounter: Payer: Self-pay | Admitting: Neurology

## 2016-05-20 ENCOUNTER — Ambulatory Visit (INDEPENDENT_AMBULATORY_CARE_PROVIDER_SITE_OTHER): Payer: BLUE CROSS/BLUE SHIELD | Admitting: Neurology

## 2016-05-20 VITALS — BP 132/80 | HR 78 | Resp 14 | Ht 61.0 in | Wt 237.0 lb

## 2016-05-20 DIAGNOSIS — G35 Multiple sclerosis: Secondary | ICD-10-CM

## 2016-05-20 DIAGNOSIS — R5382 Chronic fatigue, unspecified: Secondary | ICD-10-CM

## 2016-05-20 DIAGNOSIS — R269 Unspecified abnormalities of gait and mobility: Secondary | ICD-10-CM

## 2016-05-20 DIAGNOSIS — Z79899 Other long term (current) drug therapy: Secondary | ICD-10-CM | POA: Diagnosis not present

## 2016-05-20 DIAGNOSIS — G2581 Restless legs syndrome: Secondary | ICD-10-CM

## 2016-05-20 DIAGNOSIS — R3911 Hesitancy of micturition: Secondary | ICD-10-CM

## 2016-05-20 DIAGNOSIS — R208 Other disturbances of skin sensation: Secondary | ICD-10-CM | POA: Diagnosis not present

## 2016-05-20 MED ORDER — DEXTROAMPHETAMINE SULFATE ER 15 MG PO CP24: 45.0000 mg | ORAL_CAPSULE | Freq: Every day | ORAL | 0 refills | Status: DC

## 2016-05-20 MED ORDER — DEXTROAMPHETAMINE SULFATE 15 MG PO TABS: ORAL_TABLET | ORAL | 0 refills | Status: DC

## 2016-05-20 NOTE — Progress Notes (Signed)
[facility]  PATIENT: Allison Guzman DOB: Aug 14, 1971  REFERRING CLINICIAN:  Heather Spry HISTORY FROM:  patient REASON FOR VISIT:  Multiple sclerosis.   HISTORICAL  CHIEF COMPLAINT:  Chief Complaint  Patient presents with  . Multiple Sclerosis    Lemtrada 2nd yr. infusions were given Dec. 13, 14, 15 of 2016.  Sts. dizziness returned 2-3 weeks ago--mostly when she is up walking.  Sts. when she is sitting at her desk she isn't dizzy.  Sts. walking is much improved with Ampyra/fim    HISTORY OF PRESENT ILLNESS:  Allison Guzman is a 45 year old woman with relapsing remitting MS who had her second year course of Lemtrada treatment December 2016. She tolerated it very well with no infusion reactions.   She has been participating well in the [place] program and labwork has been ok.   In September 2016, she had an MRI of the brain for surveillance and she did have a little bit of breakthrough disease with 3 small enhancing lesions. She received 3 days of IV steroids. She never had any symptoms.   Gait/strength/sensation:   Gait is better since starting Ampyra. Gait also improved aftersurgery   She feels her knees affect gait more than MS.   Her right foot sometimes drags.    There is spasticity in both legs, helped by tizanidine though she stopped since she was sleepy.     Baclofen made her sleepy but did help her spasticity.    She reports mild burning in her feet, right > left, better than last visit.  Vision:  She has diplopia if she looks to an extreme with head also turned.  Visual acuity is fine.  Bladder:  She has some hesitancy helped by tamsulosin.  Frequency helped much by Detrol.    She no longer uses Depends.   Detrol dries her mouth  Fatigue:   She has fatigue and hypersomnolence. Dextroamphetamine helps better than Phentermine and she takes usually only on a work day.   She sleeps better with flexeril.  She gets about 5-6 hours nightly but wakes up 1-2 x  to urinate.   She snores but husband has not noted gasping or snorting   She has RLS.    Sleep:    She has restless leg symptoms at night time in bed.      She has an uncomfortable feeling in her legs and moving her legs makes her feels better for a few seconds and then it comes back.  RLS occurs throughout the night, even when she moves legs.   Gabapentin helped but caused her to be sleepy and to hallucinate.   Requip caused confusion and she stopped.    She has sleep onset insomnia >> sleep maintenance issues  Mood/cognition:   Mood is ok.  She had some depression.  Celexa helps but she is still moody. No crying spells.   No anxiety.   She denies any significant difficulty with cognition. She works full time.  Thyroid:   She sees [facility]) and has h/o thyroidectomy for papillary carcinoma.   She is on Synthroid.      MS History:   She was diagnosed with relapsing remitting MS in 2004 after presenting with diplopia and vertigo. She had MRI/CSF c/w MS.   She had a five-day course of IV steroids.    Dr. Brett Fairy placed her on Rebif.  She had a couple of exacerbations while on Rebif with some effect on her gait.  She trandferred care to me.  She was switched to Copaxone plus estriol. Additionally, CellCept was tried for short while.  Unfortunately, she continued to have breakthrough exacerbations. Around 2008, she started Tysabri. She did fairly well on Tysabri with one definite exacerbation in 2012.    She was also JCV antibody positive. Therefore, she opted to switch to Gilenya.   However, she continued to have exacerbations and disease activity on MRI and she switched to Tecfidera.   For about one year she was on Tecfidera but had a lot of GI side effects with nausea. Additionally she had breakthrough disease. December 2015 she received Lemtrada at 12 mg per day for 5 days. She tolerated the infusion fairly well with no significant systemic side effects or allergic  reactions.   We had previously discussed the risk of autoimmune disease with Lemtrada. She is post operative with thyroidectomy due to thyroid cancer.   A little tissue remains but is being suppressed.   She is on supplementation.   MRI 06/05/15 showed 3 small enhancing lesions and she received several days IV steroid at that time.   She hd her second course of Lemtrada in December 2016 x 3 days.    REVIEW OF SYSTEMS:  Constitutional: No fevers, chills, sweats, or change in appetite.  She has fatigue and hypersomnia Eyes:  She notes double vision.   No eye pain Ear, nose and throat: No hearing loss, ear pain, nasal congestion, sore throat Cardiovascular: No chest pain, palpitations Respiratory:  No shortness of breath at rest or with exertion.   No wheezes GastrointestinaI: No nausea, vomiting, diarrhea, abdominal pain, fecal incontinence Genitourinary:  see above. Musculoskeletal:  No neck pain, back pain Integumentary: No rash, pruritus, skin lesions Neurological: as above Psychiatric: No depression at this time (some in past)  No anxiety Endocrine: No palpitations, diaphoresis, change in appetite, change in weigh or increased thirst Hematologic/Lymphatic:  No anemia, purpura, petechiae. Allergic/Immunologic: No itchy/runny eyes, nasal congestion, recent allergic reactions, rashes  ALLERGIES: No Known Allergies  HOME MEDICATIONS: Outpatient Medications Prior to Visit  Medication Sig Dispense Refill  . Alemtuzumab (LEMTRADA) 12 MG/1.2ML SOLN Inject into the vein.    . baclofen (LIORESAL) 10 MG tablet One po qAM, one po q PM and two po qHS 120 each 11  . Cholecalciferol (VITAMIN D3) 10000 UNITS capsule Take 10,000 Units by mouth daily.    . citalopram (CELEXA) 40 MG tablet Take 1 tablet (40 mg total) by mouth daily. 30 tablet 11  . Cyanocobalamin (B-12 PO) Take 1 tablet by mouth daily.    . cyclobenzaprine (FLEXERIL) 5 MG tablet Take 1 tablet (5 mg total) by mouth at bedtime. 30 tablet  11  . dalfampridine 10 MG TB12 Take 10 mg by mouth 2 (two) times daily.    Marland Kitchen Dextroamphetamine Sulfate 15 MG TABS One po tid 90 tablet 0  . HYDROcodone-acetaminophen (NORCO) 5-325 MG tablet Take by mouth.    Marland Kitchen ibuprofen (ADVIL,MOTRIN) 800 MG tablet Take 800 mg by mouth every 6 (six) hours as needed for moderate pain.   0  . levothyroxine (SYNTHROID, LEVOTHROID) 175 MCG tablet Take 175 mcg by mouth daily before breakfast.    . omeprazole (PRILOSEC) 40 MG capsule Take 40 mg by mouth daily.   11  . phentermine 37.5 MG capsule Take 1 capsule (37.5 mg total) by mouth every morning. 30 capsule 5  . tamsulosin (FLOMAX) 0.4 MG CAPS capsule TAKE ONE CAPSULE BY MOUTH EVERY DAY FOR BLADDER HESITANCY  30 capsule 11  . tiZANidine (ZANAFLEX) 4 MG capsule Take 1 capsule (4 mg total) by mouth 4 (four) times daily. 120 capsule 3  . valACYclovir (VALTREX) 500 MG tablet Take 1 tablet (500 mg total) by mouth 2 (two) times daily. 60 tablet 2   Facility-Administered Medications Prior to Visit  Medication Dose Route Frequency Provider Last Rate Last Dose  . gadopentetate dimeglumine (MAGNEVIST) injection 20 mL  20 mL Intravenous Once PRN Britt Bottom, MD        PAST MEDICAL HISTORY: Past Medical History:  Diagnosis Date  . Anemia   . Arthritis   . Cancer ([place])   . Movement disorder   . Multiple sclerosis ([place])   . Thyroid cancer ([place])    2010  . Thyroid disease   . Vision abnormalities     PAST SURGICAL HISTORY: Past Surgical History:  Procedure Laterality Date  . IUD REMOVAL N/A 12/27/2014   Procedure: INTRAUTERINE DEVICE (IUD) REMOVAL;  Surgeon: Princess Bruins, MD;  Location: [facility];  Service: Gynecology;  Laterality: N/A;  . JOINT REPLACEMENT    . LAPAROSCOPY N/A 12/27/2014   Procedure: LAPAROSCOPY DIAGNOSTIC ;  Surgeon: Princess Bruins, MD;  Location: [facility];  Service: Gynecology;  Laterality: N/A;  . PATELLA RECONSTRUCTION    . THYROIDECTOMY      FAMILY HISTORY: Family History    Problem Relation Age of Onset  . Cancer Maternal Grandmother   . Cancer Maternal Grandfather   . Heart disease Paternal Grandmother   . Heart disease Paternal Grandfather   . Healthy Mother   . Thyroid cancer Father   . Healthy Brother   . Healthy Sister   . Healthy Sister     SOCIAL HISTORY:  Social History   Social History  . Marital status: Married    Spouse name: N/A  . Number of children: N/A  . Years of education: N/A   Occupational History  . Not on file.   Social History Main Topics  . Smoking status: Former Smoker    Quit date: 10/02/2003  . Smokeless tobacco: Never Used  . Alcohol use No  . Drug use: No  . Sexual activity: Yes    Birth control/ protection: Surgical   Other Topics Concern  . Not on file   Social History Narrative  . No narrative on file     PHYSICAL EXAM  Vitals:   05/20/16 0918  BP: 132/80  Pulse: 78  Resp: 14  Weight: 237 lb (107.5 kg)  Height: 5\' 1"  (1.549 m)    Body mass index is 44.78 kg/m.   General: The patient is well-developed and well-nourished and in no acute distress  Skin: Extremities are without significant edema.  Neurologic Exam  Mental status: The patient is alert and oriented x 3 at the time of the examination. The patient has apparent normal recent and remote memory, with an apparently normal attention span and concentration ability.   Speech is normal.  Cranial nerves: Extraocular movements are full.  There is good facial sensation to soft touch bilaterally.Facial strength is normal.  Trapezius and sternocleidomastoid strength is normal. No dysarthria is noted.  The tongue is midline, and the patient has symmetric elevation of the soft palate. No obvious hearing deficits are noted.  Motor:  Muscle bulk is normal.  Tone is increased in right leg.  Strength is  5 / 5 in the arms and 4+ to 5-/5 legs.   Sensory: Sensory testing shows allodynia in LFCN distribution  on the right.    She notes mildly reduced  sensation to temperature and vibration in right arm and left  leg.    Coordination: Cerebellar testing shows slightly reduced finger-nose-finger.  Poor heel -to-shin bilaterally.  Gait and station: Station is mildly wide and gait is wide.   Wide tandem. Romberg is positive.   Reflexes: Deep tendon reflexes are symmetric and brisk bilaterally.     25 foot timed walk (average of 2 trials) = 11.4 sec     DIAGNOSTIC DATA (LABS, IMAGING, TESTING) - I reviewed patient records, labs, notes, testing and imaging myself where available.    ASSESSMENT AND PLAN  Multiple sclerosis ([place]) - Plan: MR Brain W Wo Contrast  Abnormality of gait - Plan: MR Brain W Wo Contrast  High risk medication use  Chronic fatigue  Urinary hesitancy  Dysesthesia  Restless leg     1.   Continue monitoring through the [facility] program for Lemtrada.   I will check an MRI of the brain with and without contrast to make sure that there is no subclinical activity. If present, I will have her get an additional course of  Lemtrada (3 days) 2.   Change phentermine to Dexedrine capsules, 45 mg daily. 3.   Continue Detrol and tamsulosin for bladder. 4.   She will return to see me in 5-6 months or sooner if she has new or worsening neurologic symptoms.  5.   Continue Ampyra. Walking is much better 45 minute face-to-face interaction with greater than one half of the time counseling or coordinating care for her multiple sclerosis.  Gwenith Tschida A. Felecia Shelling, MD, PhD [id number], [id number] Certified in Neurology, Clinical Neurophysiology, Sleep Medicine, Pain Medicine and Neuroimaging  [facility] [address] [place], [place] [address] [phone]

## 2016-05-26 ENCOUNTER — Encounter: Payer: Self-pay | Admitting: Neurology

## 2016-05-27 ENCOUNTER — Telehealth: Payer: Self-pay | Admitting: Neurology

## 2016-05-27 NOTE — Telephone Encounter (Signed)
Patient returned Danielle's call, please call [phone].

## 2016-06-02 ENCOUNTER — Ambulatory Visit (INDEPENDENT_AMBULATORY_CARE_PROVIDER_SITE_OTHER): Payer: BLUE CROSS/BLUE SHIELD

## 2016-06-02 DIAGNOSIS — R269 Unspecified abnormalities of gait and mobility: Secondary | ICD-10-CM

## 2016-06-02 DIAGNOSIS — G35 Multiple sclerosis: Secondary | ICD-10-CM

## 2016-06-03 MED ORDER — GADOPENTETATE DIMEGLUMINE 469.01 MG/ML IV SOLN: 20.0000 mL | Freq: Once | INTRAVENOUS | Status: DC | PRN

## 2016-06-04 ENCOUNTER — Encounter: Payer: Self-pay | Admitting: Neurology

## 2016-06-07 ENCOUNTER — Telehealth: Payer: Self-pay | Admitting: *Deleted

## 2016-06-07 NOTE — Telephone Encounter (Signed)
-----   Message from Britt Bottom, MD sent at 06/04/2016  5:28 PM EDT ----- Please let her know that the MRI of the brain does not show anything new since last year.

## 2016-06-07 NOTE — Telephone Encounter (Signed)
I have advised Allison Guzman of results via MyChart/fim

## 2016-06-08 NOTE — Telephone Encounter (Signed)
Patient has MRI in office.

## 2016-06-16 ENCOUNTER — Encounter: Payer: Self-pay | Admitting: Neurology

## 2016-07-05 ENCOUNTER — Encounter: Payer: Self-pay | Admitting: Neurology

## 2016-07-13 ENCOUNTER — Encounter: Payer: Self-pay | Admitting: Neurology

## 2016-07-20 ENCOUNTER — Encounter: Payer: Self-pay | Admitting: Neurology

## 2016-07-21 ENCOUNTER — Telehealth: Payer: Self-pay | Admitting: *Deleted

## 2016-07-21 MED ORDER — DEXTROAMPHETAMINE SULFATE ER 15 MG PO CP24: ORAL_CAPSULE | ORAL | 0 refills | Status: DC

## 2016-07-21 NOTE — Telephone Encounter (Signed)
Dexedrine rx's up front [facility]/fim

## 2016-07-22 ENCOUNTER — Telehealth: Payer: Self-pay | Admitting: Neurology

## 2016-07-22 ENCOUNTER — Encounter: Payer: Self-pay | Admitting: Neurology

## 2016-07-22 NOTE — Telephone Encounter (Signed)
Tati/[facility] [phone] called to advise, she is faxing over [place] for [facility].

## 2016-07-22 NOTE — Telephone Encounter (Signed)
Ampyra PA completed via Cover My Meds./fim

## 2016-07-23 ENCOUNTER — Telehealth: Payer: Self-pay | Admitting: *Deleted

## 2016-07-23 ENCOUNTER — Telehealth: Payer: Self-pay | Admitting: Neurology

## 2016-07-23 NOTE — Telephone Encounter (Signed)
LMTC./fim 

## 2016-07-23 NOTE — Telephone Encounter (Signed)
Sam/ [employer] called requesting more information for PA on the pt. [phone]

## 2016-07-23 NOTE — Telephone Encounter (Signed)
-----   Message from Britt Bottom, MD sent at 07/22/2016  4:34 PM EST ----- Please let her know that we got some labs from her endocrinologist. Her vitamin D is low. If her endocrinologist did not start any vitamin D I would like her to take 50,000 units weekly 12 weeks and then 5000 units daily OTC after that.

## 2016-07-23 NOTE — Telephone Encounter (Signed)
New Ampyra PA form received from [place] [employer] [place].  Completed and faxed back to [facility] fax# [phone]/fim

## 2016-07-24 ENCOUNTER — Encounter: Payer: Self-pay | Admitting: Neurology

## 2016-07-26 NOTE — Telephone Encounter (Signed)
Fax received from [place] [employer] [place] (phone# [phone]) Ampyra PA approved.  Reference # [id number].  Effective  07-22-16 thru 09-12-38./fim

## 2016-08-08 ENCOUNTER — Encounter: Payer: Self-pay | Admitting: Neurology

## 2016-08-10 ENCOUNTER — Telehealth: Payer: Self-pay | Admitting: *Deleted

## 2016-08-10 MED ORDER — PHENTERMINE HCL 37.5 MG PO CAPS: 37.5000 mg | ORAL_CAPSULE | ORAL | 5 refills | Status: DC

## 2016-08-10 NOTE — Telephone Encounter (Signed)
See pt. email.  She requested to d/c Dexedrine and restart Phentermine.  Per RAS this is ok.  Dexedrine removed from med list, and Phentermine rx. faxed to [facility] per  her request/fim

## 2016-08-18 ENCOUNTER — Other Ambulatory Visit: Payer: Self-pay | Admitting: Neurology

## 2016-08-21 ENCOUNTER — Encounter: Payer: Self-pay | Admitting: Neurology

## 2016-09-10 ENCOUNTER — Encounter: Payer: Self-pay | Admitting: Neurology

## 2016-09-17 ENCOUNTER — Encounter: Payer: Self-pay | Admitting: Neurology

## 2016-09-26 ENCOUNTER — Encounter: Payer: Self-pay | Admitting: Neurology

## 2016-10-20 ENCOUNTER — Encounter: Payer: Self-pay | Admitting: Neurology

## 2016-10-25 ENCOUNTER — Encounter: Payer: Self-pay | Admitting: Neurology

## 2016-10-26 ENCOUNTER — Encounter: Payer: Self-pay | Admitting: Neurology

## 2016-11-11 ENCOUNTER — Encounter (HOSPITAL_COMMUNITY): Payer: Self-pay | Admitting: Emergency Medicine

## 2016-11-11 ENCOUNTER — Emergency Department (HOSPITAL_COMMUNITY)
Admission: EM | Admit: 2016-11-11 | Discharge: 2016-11-11 | Disposition: A | Discharge status: Home / Self Care | Payer: BLUE CROSS/BLUE SHIELD | Attending: Emergency Medicine | Admitting: Emergency Medicine

## 2016-11-11 ENCOUNTER — Emergency Department (HOSPITAL_COMMUNITY): Payer: BLUE CROSS/BLUE SHIELD

## 2016-11-11 DIAGNOSIS — Z5321 Procedure and treatment not carried out due to patient leaving prior to being seen by health care provider: Secondary | ICD-10-CM | POA: Insufficient documentation

## 2016-11-11 DIAGNOSIS — M7989 Other specified soft tissue disorders: Secondary | ICD-10-CM | POA: Diagnosis not present

## 2016-11-11 NOTE — ED Triage Notes (Signed)
Pedal pulse 2+ with doppler.

## 2016-11-11 NOTE — ED Triage Notes (Signed)
Pt c/o leg pain onset 10/19/15 after fall. Marked pitting edema to left leg from knee to ankle. Sensation decreased but present. Motor function intact.

## 2016-11-11 NOTE — ED Notes (Signed)
This patient told registration she was leaving and did not wish to be seen

## 2016-11-18 ENCOUNTER — Ambulatory Visit: Payer: BLUE CROSS/BLUE SHIELD | Admitting: Neurology

## 2016-11-19 ENCOUNTER — Ambulatory Visit (INDEPENDENT_AMBULATORY_CARE_PROVIDER_SITE_OTHER): Payer: BLUE CROSS/BLUE SHIELD | Admitting: Neurology

## 2016-11-19 ENCOUNTER — Encounter: Payer: Self-pay | Admitting: Neurology

## 2016-11-19 VITALS — BP 111/69 | HR 90 | Resp 20 | Ht 61.0 in | Wt 237.0 lb

## 2016-11-19 DIAGNOSIS — W19XXXA Unspecified fall, initial encounter: Secondary | ICD-10-CM

## 2016-11-19 DIAGNOSIS — R350 Frequency of micturition: Secondary | ICD-10-CM | POA: Diagnosis not present

## 2016-11-19 DIAGNOSIS — G2581 Restless legs syndrome: Secondary | ICD-10-CM | POA: Diagnosis not present

## 2016-11-19 DIAGNOSIS — R5382 Chronic fatigue, unspecified: Secondary | ICD-10-CM | POA: Diagnosis not present

## 2016-11-19 DIAGNOSIS — G35 Multiple sclerosis: Secondary | ICD-10-CM | POA: Diagnosis not present

## 2016-11-19 DIAGNOSIS — R208 Other disturbances of skin sensation: Secondary | ICD-10-CM | POA: Diagnosis not present

## 2016-11-19 DIAGNOSIS — G35D Multiple sclerosis, unspecified: Secondary | ICD-10-CM

## 2016-11-19 DIAGNOSIS — R269 Unspecified abnormalities of gait and mobility: Secondary | ICD-10-CM | POA: Diagnosis not present

## 2016-11-19 MED ORDER — DEXTROAMPHETAMINE SULFATE ER 15 MG PO CP24: ORAL_CAPSULE | ORAL | 0 refills | Status: DC

## 2016-11-19 MED ORDER — TOLTERODINE TARTRATE ER 4 MG PO CP24: 4.0000 mg | ORAL_CAPSULE | Freq: Every day | ORAL | 11 refills | Status: DC

## 2016-11-19 MED ORDER — MIRABEGRON ER 50 MG PO TB24: 50.0000 mg | ORAL_TABLET | Freq: Every day | ORAL | 11 refills | Status: DC

## 2016-11-19 NOTE — Progress Notes (Signed)
[facility]  PATIENT: Allison Guzman DOB: Jul 13, 1971  REFERRING CLINICIAN:  Heather Guzman HISTORY FROM:  patient REASON FOR VISIT:  Multiple sclerosis.   HISTORICAL  CHIEF COMPLAINT:  Chief Complaint  Patient presents with  . Multiple Sclerosis    2nd yr. Lemtrada infusions were in December 2016.  Some continuiing difficulty with word finding.  Some trouble with gait/falls, but thinks this is due to problems with knees, which she sees ortho for.  C/O constipation related to medications/fim    HISTORY OF PRESENT ILLNESS:  Allison Guzman is a 46 year old woman with relapsing remitting MS.   She had Allison Guzman in November 2015 and December 2016.   She is good about complying with the REMS program.    MS:   She had her second year course of Lemtrada treatment December 2016. She tolerated it very well with no infusion reactions.   She has been participating well in the [place] program and labwork has been ok.   In September 2016, she had an MRI of the brain for surveillance and she did have a little bit of breakthrough disease with 3 small enhancing lesions. She received 3 days of IV steroids. She never had any symptoms.    However.  Fall:    She fell 10/18/2016 when she tripped over a threshold.   She landed on her left knee and hand.   There were no fractures though her knee was bruised up and swollen.    She feels pretty much back to baseline at this point.      She works mostly at [employer] in [place] and incident occurred while she was leaving the office.   This is her only fall in 4 years of work.     Gait/strength/sensation:   Gait is better since starting Ampyra. Gait also improved after knee surgery in 2016 and 2017    Her right foot sometimes drags.  She has mild spasticity in both legs, helped by tizanidine though she stopped since she was sleepy.     Baclofen and tizanidine both made her sleepy but did help her spasticity.   But she stopped due to the side effects.   She reports mild burning in her feet, right > left, better than last visit.  Vision:  Vision is good.    She has mild diplopia if she looks to an extreme with head also turned.     Bladder:  She has some hesitancy helped by tamsulosin.  Frequency helped much by Detrol.    She no longer uses Depends.   Detrol dries her mouth  Fatigue:   She helps her fatigue and hypersomnolence. Dextroamphetamine helps better than Phentermine but the refills are a hassle.     Sleep:    She sleeps better with flexeril.  She gets about 5-6 hours nightly but wakes up 1-2 x to urinate.   She snores but husband has not noted gasping or snorting   She has RLS.   RLS occurs throughout the night, even when she moves legs.   Gabapentin helped but caused her to be sleepy and to hallucinate.   Requip caused confusion and she stopped.    She has sleep onset insomnia >> sleep maintenance issues  Mood/cognition:   Mood is doing ok in general.    She had mild depression.  Celexa helps but she is still moody. No crying spells.   No anxiety.   She denies any significant difficulty with cognition. She works  full time.  Thyroid:   She sees Allison Guzman) and has h/o thyroidectomy for papillary carcinoma.   She is on Synthroid.      MS History:   She was diagnosed with relapsing remitting MS in 2004 after presenting with diplopia and vertigo. She had MRI/CSF c/w MS.   She had a five-day course of IV steroids.    Dr. Brett Guzman placed her on Rebif.  She had a couple of exacerbations while on Rebif with some effect on her gait.   She trandferred care to me.  She was switched to Copaxone plus estriol. Additionally, CellCept was tried for short while.  Unfortunately, she continued to have breakthrough exacerbations. Around 2008, she started Tysabri. She did fairly well on Tysabri with one definite exacerbation in 2012.    She was also JCV antibody positive. Therefore, she opted to switch to [place].   However, she  continued to have exacerbations and disease activity on MRI and she switched to Tecfidera.   For about one year she was on Tecfidera but had a lot of GI side effects with nausea. Additionally she had breakthrough disease. December 2015 she received Lemtrada at 12 mg per day for 5 days. She tolerated the infusion fairly well with no significant systemic side effects or allergic reactions.   We had previously discussed the risk of autoimmune disease with Lemtrada. She is post operative with thyroidectomy due to thyroid cancer.   A little tissue remains but is being suppressed.   She is on supplementation.   MRI 06/05/15 showed 3 small enhancing lesions and she received several days IV steroid at that time.   She hd her second course of Lemtrada in December 2016 x 3 days.    REVIEW OF SYSTEMS:  Constitutional: No fevers, chills, sweats, or change in appetite.  She has fatigue and hypersomnia Eyes:  She notes double vision.   No eye pain Ear, nose and throat: No hearing loss, ear pain, nasal congestion, sore throat Cardiovascular: No chest pain, palpitations Respiratory:  No shortness of breath at rest or with exertion.   No wheezes GastrointestinaI: No nausea, vomiting, diarrhea, abdominal pain, fecal incontinence Genitourinary:  see above. Musculoskeletal:  No neck pain, back pain Integumentary: No rash, pruritus, skin lesions Neurological: as above Psychiatric: No depression at this time (some in past)  No anxiety Endocrine: No palpitations, diaphoresis, change in appetite, change in weigh or increased thirst Hematologic/Lymphatic:  No anemia, purpura, petechiae. Allergic/Immunologic: No itchy/runny eyes, nasal congestion, recent allergic reactions, rashes  ALLERGIES: No Known Allergies  HOME MEDICATIONS: Outpatient Medications Prior to Visit  Medication Sig Dispense Refill  . Alemtuzumab (LEMTRADA) 12 MG/1.2ML SOLN Inject into the vein.    . Cholecalciferol (VITAMIN D3) 10000 UNITS capsule  Take 10,000 Units by mouth daily.    . citalopram (CELEXA) 40 MG tablet Take 1 tablet (40 mg total) by mouth daily. 30 tablet 11  . Cyanocobalamin (B-12 PO) Take 1 tablet by mouth daily.    . cyclobenzaprine (FLEXERIL) 5 MG tablet Take 1 tablet (5 mg total) by mouth at bedtime. 30 tablet 11  . dalfampridine 10 MG TB12 Take 10 mg by mouth 2 (two) times daily.    Marland Kitchen ibuprofen (ADVIL,MOTRIN) 800 MG tablet Take 800 mg by mouth every 6 (six) hours as needed for moderate pain.   0  . levothyroxine (SYNTHROID, LEVOTHROID) 175 MCG tablet Take 175 mcg by mouth daily before breakfast.    . omeprazole (PRILOSEC) 40 MG capsule Take  40 mg by mouth daily.   11  . tamsulosin (FLOMAX) 0.4 MG CAPS capsule TAKE ONE CAPSULE BY MOUTH EVERY DAY FOR BLADDER HESITANCY 30 capsule 11  . dextroamphetamine (DEXEDRINE SPANSULE) 15 MG 24 hr capsule Take 3 capsules (45mg ) by mouth daily 90 capsule 0  . phentermine 37.5 MG capsule Take 1 capsule (37.5 mg total) by mouth every morning. 30 capsule 5  . HYDROcodone-acetaminophen (NORCO) 5-325 MG tablet Take by mouth.     Facility-Administered Medications Prior to Visit  Medication Dose Route Frequency Provider Last Rate Last Dose  . gadopentetate dimeglumine (MAGNEVIST) injection 20 mL  20 mL Intravenous Once PRN Britt Bottom, MD        PAST MEDICAL HISTORY: Past Medical History:  Diagnosis Date  . Anemia   . Arthritis   . Cancer ([place])   . Movement disorder   . Multiple sclerosis ([place])   . Thyroid cancer ([place])    2010  . Thyroid disease   . Vision abnormalities     PAST SURGICAL HISTORY: Past Surgical History:  Procedure Laterality Date  . IUD REMOVAL N/A 12/27/2014   Procedure: INTRAUTERINE DEVICE (IUD) REMOVAL;  Surgeon: Princess Bruins, MD;  Location: [facility];  Service: Gynecology;  Laterality: N/A;  . JOINT REPLACEMENT    . LAPAROSCOPY N/A 12/27/2014   Procedure: LAPAROSCOPY DIAGNOSTIC ;  Surgeon: Princess Bruins, MD;  Location: [facility];  Service:  Gynecology;  Laterality: N/A;  . PATELLA RECONSTRUCTION    . THYROIDECTOMY      FAMILY HISTORY: Family History  Problem Relation Age of Onset  . Cancer Maternal Grandmother   . Cancer Maternal Grandfather   . Heart disease Paternal Grandmother   . Heart disease Paternal Grandfather   . Healthy Mother   . Thyroid cancer Father   . Healthy Brother   . Healthy Sister   . Healthy Sister     SOCIAL HISTORY:  Social History   Social History  . Marital status: Married    Spouse name: N/A  . Number of children: N/A  . Years of education: N/A   Occupational History  . Not on file.   Social History Main Topics  . Smoking status: Former Smoker    Quit date: 10/02/2003  . Smokeless tobacco: Never Used  . Alcohol use No  . Drug use: No  . Sexual activity: Yes    Birth control/ protection: Surgical   Other Topics Concern  . Not on file   Social History Narrative  . No narrative on file     PHYSICAL EXAM  Vitals:   11/19/16 0847  BP: 111/69  Pulse: 90  Resp: 20  Weight: 237 lb (107.5 kg)  Height: 5\' 1"  (1.549 m)    Body mass index is 44.78 kg/m.   General: The patient is well-developed and well-nourished and in no acute distress  Skin: Extremities are without significant edema.  Neurologic Exam  Mental status: The patient is alert and oriented x 3 at the time of the examination. The patient has apparent normal recent and remote memory, with an apparently normal attention span and concentration ability.   Speech is normal.  Cranial nerves: Extraocular movements are full.  Facial strength and sensation are normal.  Trapezius and sternocleidomastoid strength is normal. No dysarthria is noted.  The tongue is midline, and the patient has symmetric elevation of the soft palate. No obvious hearing deficits are noted.  Motor:  Muscle bulk is normal.  Tone is increased in right  leg.  Strength is  5 / 5 in the arms and 4+ to 5-/5 right leg   Sensory: Sensory testing  shows allodynia in LFCN distribution on the right.  Also, she notes mildly reduced  sensation to temperature and vibration in right arm and left  leg.    Coordination: Cerebellar testing shows slightly reduced finger-nose-finger.  Poor heel -to-shin bilaterally.  Gait and station: The station is slightly wide. Her gait is wide. Tandem walk is poor. Romberg is positive.   Reflexes: Deep tendon reflexes are symmetric and brisk bilaterally.        DIAGNOSTIC DATA (LABS, IMAGING, TESTING) - I reviewed patient records, labs, notes, testing and imaging myself where available.    ASSESSMENT AND PLAN  Multiple sclerosis (HCC)  Abnormal gait  Chronic fatigue  Dysesthesia  Restless leg  Urinary frequency  Fall, initial encounter    1.   She appears to be doing well on [employer]. She will continue the monitoring through the REMS program. Later this year, around time of next visit,  I will check an MRI of the brain with and without contrast to make sure that there is no subclinical activity. If present, I will have her get an additional course of  Lemtrada (3 days) 2.    Dexedrine capsules, 45 mg daily for fatigue and weight loss 3.   Continue Detrol for bladder.  Ad Myrbetriq 50 mg. She has had a suboptimal response to several anticholinergic agents (Ditropan/Detrol) 4.   Continue Ampyra. Walking is much better  She will return to see me in 5-6 months or sooner if she has new or worsening neurologic symptoms.    Richard A. Felecia Shelling, MD, PhD 12/19/5460, 7:03 AM Certified in Neurology, Clinical Neurophysiology, Sleep Medicine, Pain Medicine and Neuroimaging  [facility] [address], [place], [place] [address] [phone]

## 2016-12-16 ENCOUNTER — Other Ambulatory Visit: Payer: Self-pay | Admitting: Neurology

## 2017-01-04 ENCOUNTER — Telehealth: Payer: Self-pay | Admitting: *Deleted

## 2017-01-04 ENCOUNTER — Encounter: Payer: Self-pay | Admitting: Neurology

## 2017-01-04 MED ORDER — DEXTROAMPHETAMINE SULFATE ER 15 MG PO CP24: ORAL_CAPSULE | ORAL | 0 refills | Status: DC

## 2017-01-04 NOTE — Telephone Encounter (Signed)
Dexedrine rx. awaiting YY sig, as RAS is ooo this week, then will place up front [facility]/fim

## 2017-01-05 ENCOUNTER — Other Ambulatory Visit: Payer: Self-pay | Admitting: Neurology

## 2017-01-14 ENCOUNTER — Encounter: Payer: Self-pay | Admitting: Neurology

## 2017-01-15 ENCOUNTER — Other Ambulatory Visit: Payer: Self-pay | Admitting: Neurology

## 2017-01-18 ENCOUNTER — Encounter: Payer: Self-pay | Admitting: Neurology

## 2017-01-27 ENCOUNTER — Encounter: Payer: Self-pay | Admitting: Neurology

## 2017-02-04 ENCOUNTER — Other Ambulatory Visit: Payer: Self-pay | Admitting: Neurology

## 2017-02-15 ENCOUNTER — Encounter: Payer: Self-pay | Admitting: Neurology

## 2017-02-15 ENCOUNTER — Other Ambulatory Visit: Payer: Self-pay | Admitting: *Deleted

## 2017-02-15 ENCOUNTER — Telehealth: Payer: Self-pay | Admitting: *Deleted

## 2017-02-15 DIAGNOSIS — G35 Multiple sclerosis: Secondary | ICD-10-CM

## 2017-02-15 DIAGNOSIS — Z79899 Other long term (current) drug therapy: Secondary | ICD-10-CM

## 2017-02-15 DIAGNOSIS — T148XXA Other injury of unspecified body region, initial encounter: Secondary | ICD-10-CM

## 2017-02-15 NOTE — Telephone Encounter (Signed)
I have spoken with Amonie this afternoon.  Per RAS, since we have not received last Lemtrada labs from [facility], he would like a stat plt. ct. now.  Brittnee sts. she is not able to come in today, will come first thing in the am./fim

## 2017-02-16 ENCOUNTER — Other Ambulatory Visit (INDEPENDENT_AMBULATORY_CARE_PROVIDER_SITE_OTHER): Payer: Self-pay

## 2017-02-16 ENCOUNTER — Telehealth: Payer: Self-pay | Admitting: *Deleted

## 2017-02-16 ENCOUNTER — Encounter: Payer: Self-pay | Admitting: Neurology

## 2017-02-16 DIAGNOSIS — Z79899 Other long term (current) drug therapy: Secondary | ICD-10-CM

## 2017-02-16 DIAGNOSIS — G35 Multiple sclerosis: Secondary | ICD-10-CM

## 2017-02-16 DIAGNOSIS — Z0289 Encounter for other administrative examinations: Secondary | ICD-10-CM

## 2017-02-16 DIAGNOSIS — T148XXA Other injury of unspecified body region, initial encounter: Secondary | ICD-10-CM

## 2017-02-16 LAB — CBC WITH DIFFERENTIAL/PLATELET
BASOS: 1 %
Basophils Absolute: 0 10*3/uL (ref 0.0–0.2)
EOS (ABSOLUTE): 0.1 10*3/uL (ref 0.0–0.4)
EOS: 3 %
HEMATOCRIT: 34.4 % (ref 34.0–46.6)
Hemoglobin: 11.2 g/dL (ref 11.1–15.9)
IMMATURE GRANS (ABS): 0 10*3/uL (ref 0.0–0.1)
IMMATURE GRANULOCYTES: 0 %
LYMPHS: 26 %
Lymphocytes Absolute: 1.3 10*3/uL (ref 0.7–3.1)
MCH: 27.5 pg (ref 26.6–33.0)
MCHC: 32.6 g/dL (ref 31.5–35.7)
MCV: 84 fL (ref 79–97)
Monocytes Absolute: 0.3 10*3/uL (ref 0.1–0.9)
Monocytes: 5 %
Neutrophils Absolute: 3.2 10*3/uL (ref 1.4–7.0)
Neutrophils: 65 %
PLATELETS: 269 10*3/uL (ref 150–379)
RBC: 4.08 x10E6/uL (ref 3.77–5.28)
RDW: 16 % — ABNORMAL HIGH (ref 12.3–15.4)
WBC: 4.9 10*3/uL (ref 3.4–10.8)

## 2017-02-16 NOTE — Telephone Encounter (Signed)
Results sent to pt. via My Chart/fim 

## 2017-02-16 NOTE — Telephone Encounter (Signed)
-----   Message from Britt Bottom, MD sent at 02/16/2017 12:11 PM EDT ----- Please let her know that the platelets were fine.

## 2017-03-04 ENCOUNTER — Other Ambulatory Visit: Payer: Self-pay | Admitting: Neurology

## 2017-03-14 ENCOUNTER — Encounter: Payer: Self-pay | Admitting: Neurology

## 2017-03-15 ENCOUNTER — Telehealth: Payer: Self-pay | Admitting: *Deleted

## 2017-03-15 MED ORDER — DEXTROAMPHETAMINE SULFATE ER 15 MG PO CP24: ORAL_CAPSULE | ORAL | 0 refills | Status: DC

## 2017-03-15 NOTE — Telephone Encounter (Signed)
Rx. up front [facility] per emailed request/fim 

## 2017-03-18 ENCOUNTER — Encounter: Payer: Self-pay | Admitting: Neurology

## 2017-03-21 ENCOUNTER — Other Ambulatory Visit: Payer: Self-pay | Admitting: *Deleted

## 2017-03-21 DIAGNOSIS — H539 Unspecified visual disturbance: Secondary | ICD-10-CM

## 2017-03-21 DIAGNOSIS — G35 Multiple sclerosis: Secondary | ICD-10-CM

## 2017-03-23 ENCOUNTER — Encounter: Payer: Self-pay | Admitting: Neurology

## 2017-04-15 ENCOUNTER — Other Ambulatory Visit: Payer: Self-pay | Admitting: Neurology

## 2017-04-19 ENCOUNTER — Encounter: Payer: Self-pay | Admitting: Neurology

## 2017-04-19 MED ORDER — DEXTROAMPHETAMINE SULFATE ER 15 MG PO CP24: ORAL_CAPSULE | ORAL | 0 refills | Status: DC

## 2017-04-19 NOTE — Telephone Encounter (Signed)
Rx printed, awaiting MD signature.  

## 2017-05-17 ENCOUNTER — Encounter: Payer: Self-pay | Admitting: Physician Assistant

## 2017-05-27 ENCOUNTER — Ambulatory Visit: Payer: BLUE CROSS/BLUE SHIELD | Admitting: Neurology

## 2017-06-01 ENCOUNTER — Encounter: Payer: Self-pay | Admitting: *Deleted

## 2017-06-16 ENCOUNTER — Encounter: Payer: Self-pay | Admitting: Neurology

## 2017-06-16 ENCOUNTER — Ambulatory Visit (INDEPENDENT_AMBULATORY_CARE_PROVIDER_SITE_OTHER): Payer: BLUE CROSS/BLUE SHIELD | Admitting: Neurology

## 2017-06-16 VITALS — BP 122/64 | Resp 18 | Ht 61.0 in | Wt 245.0 lb

## 2017-06-16 DIAGNOSIS — Z6841 Body Mass Index (BMI) 40.0 and over, adult: Secondary | ICD-10-CM | POA: Insufficient documentation

## 2017-06-16 DIAGNOSIS — G43009 Migraine without aura, not intractable, without status migrainosus: Secondary | ICD-10-CM

## 2017-06-16 DIAGNOSIS — M1710 Unilateral primary osteoarthritis, unspecified knee: Secondary | ICD-10-CM | POA: Diagnosis not present

## 2017-06-16 DIAGNOSIS — R269 Unspecified abnormalities of gait and mobility: Secondary | ICD-10-CM

## 2017-06-16 DIAGNOSIS — G35 Multiple sclerosis: Secondary | ICD-10-CM | POA: Diagnosis not present

## 2017-06-16 DIAGNOSIS — R208 Other disturbances of skin sensation: Secondary | ICD-10-CM | POA: Diagnosis not present

## 2017-06-16 DIAGNOSIS — R5382 Chronic fatigue, unspecified: Secondary | ICD-10-CM

## 2017-06-16 DIAGNOSIS — R350 Frequency of micturition: Secondary | ICD-10-CM

## 2017-06-16 DIAGNOSIS — Z79899 Other long term (current) drug therapy: Secondary | ICD-10-CM

## 2017-06-16 DIAGNOSIS — M179 Osteoarthritis of knee, unspecified: Secondary | ICD-10-CM

## 2017-06-16 DIAGNOSIS — E890 Postprocedural hypothyroidism: Secondary | ICD-10-CM

## 2017-06-16 HISTORY — DX: Migraine without aura, not intractable, without status migrainosus: G43.009

## 2017-06-16 MED ORDER — TOPIRAMATE 50 MG PO TABS: 100.0000 mg | ORAL_TABLET | Freq: Two times a day (BID) | ORAL | 11 refills | Status: DC

## 2017-06-16 MED ORDER — DEXTROAMPHETAMINE SULFATE ER 15 MG PO CP24: ORAL_CAPSULE | ORAL | 0 refills | Status: DC

## 2017-06-16 NOTE — Progress Notes (Signed)
[facility]  PATIENT: Allison Guzman DOB: 10-11-1970  REFERRING CLINICIAN:  Heather Spry HISTORY FROM:  patient REASON FOR VISIT:  Multiple sclerosis.   HISTORICAL  CHIEF COMPLAINT:  Chief Complaint  Patient presents with  . Multiple Sclerosis    2nd yr. Lemtrada infusions were Dec. 12, 13, 14 of 2016. Labs are utd. Feels memory is worse./fim    HISTORY OF PRESENT ILLNESS:  Allison Guzman is a 46 year old woman with relapsing remitting MS.     Update 06/16/2017:    She had her first [name] infusion November 2015 and the second [name] infusion December 2016.   She has been compliant with the REMS program. With the exception of her TSH which has fluctuated quite a bit (she is on supplementation and has had thyroid surgery and is followed by endocrinology), the labs have been excellent.   Her last MRI of the brain 06/04/2016 did not show any compared to the MRI from 2016.  Physically, she feels stable.   Her gait is doing ok.   She has mild weakness and spasticity in her legs.    She feels Ampyra has greatly helped.    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    She notes some numbness in her legs but it is not troublesome.   Bladder function is doing ok on Detrol and tamsulosin but was better last year.   She has seen [facility].   Vision is a little worse and she feels she needs new glasses.   She has mild diplopia with lateral gaze.      She feels she is always tired, physically and cognitively.   She sleeps ok at night except mild night sweats.   She notes cognitive issues.   STM is reduced.   She also notes issues with verbal fluency, focus and attention.    Mood fluctuates and she is often irritable.     She gets frequent low grade migraines with nausea and photo/phonophobia.  These were worse as a teenager.   She has never taken a prophylactic agent.    Her daughter has had vertigo but the description sounds more like BPPV rather  than a central etiology.     From 11/19/2016  MS:   She had her second year course of Lemtrada treatment December 2016. She tolerated it very well with no infusion reactions.   She has been participating well in the [place] program and labwork has been ok.   In September 2016, she had an MRI of the brain for surveillance and she did have a little bit of breakthrough disease with 3 small enhancing lesions. She received 3 days of IV steroids. She never had any symptoms.    However.  Fall:    She fell 10/18/2016 when she tripped over a threshold.   She landed on her left knee and hand.   There were no fractures though her knee was bruised up and swollen.    She feels pretty much back to baseline at this point.      She works mostly at [employer] in [place] and incident occurred while she was leaving the office.   This is her only fall in 4 years of work.     Gait/strength/sensation:   Gait is better since starting Ampyra. Gait also improved after knee surgery in 2016 and 2017    Her right foot sometimes drags.  She has mild spasticity in both  legs, helped by tizanidine though she stopped since she was sleepy.     Baclofen and tizanidine both made her sleepy but did help her spasticity.   But she stopped due to the side effects.  She reports mild burning in her feet, right > left, better than last visit.  Vision:  Vision is good.    She has mild diplopia if she looks to an extreme with head also turned.     Bladder:  She has some hesitancy helped by tamsulosin.  Frequency helped much by Detrol.    She no longer uses Depends.   Detrol dries her mouth  Fatigue:   She helps her fatigue and hypersomnolence. Dextroamphetamine helps better than Phentermine but the refills are a hassle.     Sleep:    She sleeps better with flexeril.  She gets about 5-6 hours nightly but wakes up 1-2 x to urinate.   She snores but husband has not noted gasping or snorting   She has RLS.   RLS occurs throughout the night, even when  she moves legs.   Gabapentin helped but caused her to be sleepy and to hallucinate.   Requip caused confusion and she stopped.    She has sleep onset insomnia >> sleep maintenance issues  Mood/cognition:   Mood is doing ok in general.    She had mild depression.  Celexa helps but she is still moody. No crying spells.   No anxiety.   She denies any significant difficulty with cognition. She works full time.  Thyroid:   She sees [facility]) and has h/o thyroidectomy for papillary carcinoma.   She is on Synthroid.      MS History:   She was diagnosed with relapsing remitting MS in 2004 after presenting with diplopia and vertigo. She had MRI/CSF c/w MS.   She had a five-day course of IV steroids.    Dr. Brett Fairy placed her on Rebif.  She had a couple of exacerbations while on Rebif with some effect on her gait.   She trandferred care to me.  She was switched to Copaxone plus estriol. Additionally, CellCept was tried for short while.  Unfortunately, she continued to have breakthrough exacerbations. Around 2008, she started Tysabri. She did fairly well on Tysabri with one definite exacerbation in 2012.    She was also JCV antibody positive. Therefore, she opted to switch to [place].   However, she continued to have exacerbations and disease activity on MRI and she switched to Tecfidera.   For about one year she was on Tecfidera but had a lot of GI side effects with nausea. Additionally she had breakthrough disease. December 2015 she received Lemtrada at 12 mg per day for 5 days. She tolerated the infusion fairly well with no significant systemic side effects or allergic reactions.   We had previously discussed the risk of autoimmune disease with Lemtrada. She is post operative with thyroidectomy due to thyroid cancer.   A little tissue remains but is being suppressed.   She is on supplementation.   MRI 06/05/15 showed 3 small enhancing lesions and she received several days IV steroid at  that time.   She hd her second course of Lemtrada in December 2016 x 3 days.    REVIEW OF SYSTEMS:  Constitutional: No fevers, chills, sweats, or change in appetite.  She has fatigue and hypersomnia Eyes:  She notes double vision.   No eye pain Ear, nose and throat: No hearing loss, ear  pain, nasal congestion, sore throat Cardiovascular: No chest pain, palpitations Respiratory:  No shortness of breath at rest or with exertion.   No wheezes GastrointestinaI: No nausea, vomiting, diarrhea, abdominal pain, fecal incontinence Genitourinary:  see above. Musculoskeletal:  No neck pain, back pain Integumentary: No rash, pruritus, skin lesions Neurological: as above Psychiatric: No depression at this time (some in past)  No anxiety Endocrine: No palpitations, diaphoresis, change in appetite, change in weigh or increased thirst Hematologic/Lymphatic:  No anemia, purpura, petechiae. Allergic/Immunologic: No itchy/runny eyes, nasal congestion, recent allergic reactions, rashes  ALLERGIES: No Known Allergies  HOME MEDICATIONS: Outpatient Medications Prior to Visit  Medication Sig Dispense Refill  . Alemtuzumab (LEMTRADA) 12 MG/1.2ML SOLN Inject into the vein.    . AMPYRA 10 MG TB12 TAKE ONE TABLET BY MOUTH EVERY 12 HOURS 60 tablet 5  . citalopram (CELEXA) 40 MG tablet TAKE 1 TABLET(40 MG) BY MOUTH DAILY 90 tablet 1  . ibuprofen (ADVIL,MOTRIN) 800 MG tablet Take 800 mg by mouth every 6 (six) hours as needed for moderate pain.   0  . levothyroxine (SYNTHROID, LEVOTHROID) 175 MCG tablet Take 175 mcg by mouth daily before breakfast.    . omeprazole (PRILOSEC) 40 MG capsule Take 40 mg by mouth daily.   11  . tolterodine (DETROL LA) 4 MG 24 hr capsule Take 1 capsule (4 mg total) by mouth daily. 30 capsule 11  . Cholecalciferol (VITAMIN D3) 10000 UNITS capsule Take 10,000 Units by mouth daily.    Marland Kitchen dextroamphetamine (DEXEDRINE SPANSULE) 15 MG 24 hr capsule Take 3 capsules (45mg ) by mouth daily 90  capsule 0  . Cyanocobalamin (B-12 PO) Take 1 tablet by mouth daily.    . cyclobenzaprine (FLEXERIL) 5 MG tablet Take 1 tablet (5 mg total) by mouth at bedtime. (Patient not taking: Reported on 06/16/2017) 30 tablet 11  . HYDROcodone-acetaminophen (NORCO) 5-325 MG tablet Take by mouth.    . mirabegron ER (MYRBETRIQ) 50 MG TB24 tablet Take 1 tablet (50 mg total) by mouth daily. (Patient not taking: Reported on 06/16/2017) 30 tablet 11  . tamsulosin (FLOMAX) 0.4 MG CAPS capsule TAKE ONE CAPSULE BY MOUTH EVERY DAY FOR BLADDER HESITANCY 30 capsule 11  . tamsulosin (FLOMAX) 0.4 MG CAPS capsule TAKE ONE CAPSULE BY MOUTH EVERY DAY FOR BLADDER HESITANCY 30 capsule 0  . tamsulosin (FLOMAX) 0.4 MG CAPS capsule TAKE ONE CAPSULE BY MOUTH EVERY DAY FOR BLADDER HESITANCY 30 capsule 0   Facility-Administered Medications Prior to Visit  Medication Dose Route Frequency Provider Last Rate Last Dose  . gadopentetate dimeglumine (MAGNEVIST) injection 20 mL  20 mL Intravenous Once PRN Sater, Nanine Means, MD        PAST MEDICAL HISTORY: Past Medical History:  Diagnosis Date  . Anemia   . Arthritis   . Cancer ([place])   . Common migraine 06/16/2017  . Movement disorder   . Multiple sclerosis ([place])   . Thyroid cancer ([place])    2010  . Thyroid disease   . Vision abnormalities     PAST SURGICAL HISTORY: Past Surgical History:  Procedure Laterality Date  . IUD REMOVAL N/A 12/27/2014   Procedure: INTRAUTERINE DEVICE (IUD) REMOVAL;  Surgeon: Princess Bruins, MD;  Location: [facility];  Service: Gynecology;  Laterality: N/A;  . JOINT REPLACEMENT    . LAPAROSCOPY N/A 12/27/2014   Procedure: LAPAROSCOPY DIAGNOSTIC ;  Surgeon: Princess Bruins, MD;  Location: [facility];  Service: Gynecology;  Laterality: N/A;  . PATELLA RECONSTRUCTION    . THYROIDECTOMY  FAMILY HISTORY: Family History  Problem Relation Age of Onset  . Cancer Maternal Grandmother   . Cancer Maternal Grandfather   . Heart disease Paternal Grandmother    . Heart disease Paternal Grandfather   . Healthy Mother   . Thyroid cancer Father   . Healthy Brother   . Healthy Sister   . Healthy Sister     SOCIAL HISTORY:  Social History   Social History  . Marital status: Married    Spouse name: N/A  . Number of children: N/A  . Years of education: N/A   Occupational History  . Not on file.   Social History Main Topics  . Smoking status: Former Smoker    Quit date: 10/02/2003  . Smokeless tobacco: Never Used  . Alcohol use No  . Drug use: No  . Sexual activity: Yes    Birth control/ protection: Surgical   Other Topics Concern  . Not on file   Social History Narrative  . No narrative on file     PHYSICAL EXAM  Vitals:   06/16/17 0842  BP: 122/64  Resp: 18  Weight: 245 lb (111.1 kg)  Height: 5\' 1"  (1.549 m)    Body mass index is 46.29 kg/m.   General: The patient is an overweight woman in no acute distress  Skin: Extremities are without significant edema.  Neurologic Exam  Mental status: The patient is alert and oriented x 3 at the time of the examination. The patient has apparent normal recent and remote memory, with an apparently normal attention span and concentration ability.   Speech is normal.  Cranial nerves: Extraocular movements are full.  Facial strength and sensation are normal.  Trapezius and sternocleidomastoid strength is normal. No dysarthria is noted.  The tongue is midline, and the patient has symmetric elevation of the soft palate. No obvious hearing deficits are noted.  Motor:  Muscle bulk is normal.  Tone is increased in the right leg. Strength is 5/5 in the arms and 4+/5 left leg and 5-/5  Sensory: Sensory testing shows allodynia in LFCN distribution on the right.  Also, she notes mildly reduced  sensation to temperature and vibration in right arm and left  leg.    Coordination: Cerebellar testing shows slightly reduced finger-nose-finger on the left.  She has poor heel -to-shin  bilaterally.  Gait and station: Station is slightly wide. The gait is wide and she has difficulty attempting the tandem walk. Romberg sign is mildly positive..   Reflexes: Deep tendon reflexes are symmetric and brisk bilaterally.        DIAGNOSTIC DATA (LABS, IMAGING, TESTING) - I reviewed patient records, labs, notes, testing and imaging myself where available.    ASSESSMENT AND PLAN  Abnormality of gait  Multiple sclerosis (HCC) - Plan: MR BRAIN W WO CONTRAST  Postoperative hypothyroidism  High risk medication use  Chronic fatigue  Dysesthesia  Urinary frequency  Osteoarthritis of knee, unspecified laterality, unspecified osteoarthritis type  Migraine without aura and without status migrainosus, not intractable  BMI 45.0-49.9, adult ([place])    1.   Her MS is stable and Lemtrada and she is compliant with the REMS program. She will need to continue to do this until December 2020 when her monitoring will be complete. We'll check an MRI of the brain with and without contrast to determine if there is any subclinical progression of her MS.  If present, I will have her get an additional course of  Lemtrada (3 days).  We had a conversation about her children's risk of developing MS. Her daughter has about a 1/30 risk and her son has about a 1/60 risk, statistically. It is reasonable for them to take extra vitamin D if not contraindicated. They could also have the vitamin D levels checked next time they have blood work. 2.    For focus/attention and weight loss she will take Dexedrine capsules, 45 mg daily 3.    I will add Topamax 50 mg twice a day. This should help her migraine headaches and also maybe help [place] the weight loss. She needs to lose about 40 pounds before or orthopedics will do her knee surgery. 4.    Continue Detrol for the bladder. She has seen urology and will contact them if urinary function worsens.  5.   Continue Ampyra. Walking is much better 5.   She will  return to see me in 5-6 months or sooner if she has new or worsening neurologic symptoms.   45 minute face-to-face interaction with greater than one half of the time counseling and coordinating care with her MS, loss, cognitive issues, and MS family history issues.  Richard A. Felecia Shelling, MD, PhD 50/11/8880, 8:00 AM Certified in Neurology, Clinical Neurophysiology, Sleep Medicine, Pain Medicine and Neuroimaging  [facility] [address], [place],  [address] [phone]

## 2017-06-17 ENCOUNTER — Encounter: Payer: Self-pay | Admitting: Neurology

## 2017-06-20 ENCOUNTER — Other Ambulatory Visit: Payer: Self-pay | Admitting: *Deleted

## 2017-06-20 DIAGNOSIS — F419 Anxiety disorder, unspecified: Secondary | ICD-10-CM

## 2017-06-20 DIAGNOSIS — F3289 Other specified depressive episodes: Secondary | ICD-10-CM

## 2017-06-28 ENCOUNTER — Telehealth: Payer: Self-pay | Admitting: Neurology

## 2017-06-28 NOTE — Telephone Encounter (Signed)
Allison Guzman from [facility] calling to inform that a Prior Authorization is needed for AMPYRA 10 MG TB12.  [name] stated it needs to be written just as the prescription is

## 2017-06-28 NOTE — Telephone Encounter (Signed)
Spoke with [facility] and requested they fax PA form to me at [phone]

## 2017-06-29 ENCOUNTER — Ambulatory Visit (INDEPENDENT_AMBULATORY_CARE_PROVIDER_SITE_OTHER): Payer: BLUE CROSS/BLUE SHIELD

## 2017-06-29 ENCOUNTER — Encounter: Payer: Self-pay | Admitting: Neurology

## 2017-06-29 DIAGNOSIS — G35 Multiple sclerosis: Secondary | ICD-10-CM

## 2017-06-29 MED ORDER — GADOPENTETATE DIMEGLUMINE 469.01 MG/ML IV SOLN: 20.0000 mL | Freq: Once | INTRAVENOUS | Status: AC | PRN

## 2017-06-30 ENCOUNTER — Encounter: Payer: Self-pay | Admitting: Neurology

## 2017-06-30 ENCOUNTER — Telehealth: Payer: Self-pay | Admitting: *Deleted

## 2017-06-30 NOTE — Telephone Encounter (Signed)
-----   Message from Britt Bottom, MD sent at 06/30/2017  2:54 PM EDT ----- Please let her know the MRI did not show any new lesions

## 2017-06-30 NOTE — Telephone Encounter (Signed)
MRI results sent via MyChart/fim

## 2017-08-10 ENCOUNTER — Encounter: Payer: Self-pay | Admitting: Neurology

## 2017-08-16 ENCOUNTER — Encounter: Payer: Self-pay | Admitting: Neurology

## 2017-09-07 ENCOUNTER — Encounter: Payer: Self-pay | Admitting: Neurology

## 2017-09-07 ENCOUNTER — Other Ambulatory Visit: Payer: Self-pay | Admitting: Neurology

## 2017-09-08 MED ORDER — DEXTROAMPHETAMINE SULFATE ER 15 MG PO CP24: ORAL_CAPSULE | ORAL | 0 refills | Status: DC

## 2017-09-08 NOTE — Telephone Encounter (Signed)
Dr. Jaynee Eagles signed RX for dexedrine and I have placed it for pick up at the front desk.

## 2017-10-12 ENCOUNTER — Encounter: Payer: Self-pay | Admitting: Neurology

## 2017-10-21 ENCOUNTER — Other Ambulatory Visit: Payer: Self-pay | Admitting: Neurology

## 2017-10-25 ENCOUNTER — Encounter: Payer: Self-pay | Admitting: Neurology

## 2017-11-17 ENCOUNTER — Encounter: Payer: Self-pay | Admitting: Neurology

## 2017-11-29 ENCOUNTER — Encounter: Payer: Self-pay | Admitting: Neurology

## 2017-12-15 ENCOUNTER — Other Ambulatory Visit: Payer: Self-pay

## 2017-12-15 ENCOUNTER — Encounter: Payer: Self-pay | Admitting: Neurology

## 2017-12-15 ENCOUNTER — Ambulatory Visit: Payer: BLUE CROSS/BLUE SHIELD | Admitting: Neurology

## 2017-12-15 VITALS — BP 122/75 | HR 97 | Resp 18 | Ht 61.0 in | Wt 248.0 lb

## 2017-12-15 DIAGNOSIS — G43009 Migraine without aura, not intractable, without status migrainosus: Secondary | ICD-10-CM

## 2017-12-15 DIAGNOSIS — G35 Multiple sclerosis: Secondary | ICD-10-CM | POA: Diagnosis not present

## 2017-12-15 DIAGNOSIS — Z79899 Other long term (current) drug therapy: Secondary | ICD-10-CM

## 2017-12-15 DIAGNOSIS — R269 Unspecified abnormalities of gait and mobility: Secondary | ICD-10-CM

## 2017-12-15 DIAGNOSIS — R5382 Chronic fatigue, unspecified: Secondary | ICD-10-CM | POA: Diagnosis not present

## 2017-12-15 DIAGNOSIS — R3911 Hesitancy of micturition: Secondary | ICD-10-CM

## 2017-12-15 MED ORDER — SILODOSIN 8 MG PO CAPS: 8.0000 mg | ORAL_CAPSULE | Freq: Every day | ORAL | 5 refills | Status: DC

## 2017-12-15 MED ORDER — DEXTROAMPHETAMINE SULFATE ER 15 MG PO CP24: ORAL_CAPSULE | ORAL | 0 refills | Status: DC

## 2017-12-15 NOTE — Progress Notes (Signed)
[facility]  PATIENT: Allison Guzman DOB: Apr 17, 1971  REFERRING CLINICIAN:  Heather Spry HISTORY FROM:  patient REASON FOR VISIT:  Multiple sclerosis.   HISTORICAL  CHIEF COMPLAINT:  Chief Complaint  Patient presents with  . Multiple Sclerosis    2nd yr. Lemtrada infusions were December 12-14 of 2016. Last MRI brain showed no new lesions. She recently had some gum grafting, had to stop Dexedrine for procedure, but feels it helps and plans to restart soon.  Needs a new rx/fim    HISTORY OF PRESENT ILLNESS:  Allison Guzman is a 47 year old woman with relapsing remitting MS.    Update 12/15/2017: Her MS is generally doing well.  The second course of Lemtrada infusions were December 2016.  She is compliant with the REMS program.  Her TSH has fluctuated quite a bit but she is under the care of an endocrinologist and she has had thyroid surgery in the past.  Other labs are fine.  She has not had any exacerbations.  She feels her gait is stable and is a little bit off balance.  There is mild weakness and spasticity in the legs. She feels she no longer on Ampyra but it helped earlier.   Sometimes she notes some numbness in the legs.  She has urinary frequency and urgency helped by Detrol and tamsulosin.  She has seen urology.  She notes no new issues with her vision.  She has had nystagmus and diplopia in the past but now only to right gaze.  Colors are desaturated on the right.   Continues to experience fatigue that is both physical and cognitive.  Dexedrine has helped some.  She generally sleeps well at night.  She has had some mood irritability and is sometimes agitated but denies frank depression.  Celexa has helped.  Cognition is stable with mild issues with short-term memory, focus, attention and verbal fluency.  Dexedrine has helped some of this.  Migraines are not too bad since a new pair of glasses and she stopped Topamax.  The MRI of the brain performed 06/30/2017  was personally reviewed and it showed typical MS lesions but there were no new or enlarging lesions.  Lab work was reviewed and summarized above  Update 06/16/2017:    She had her first [name] infusion November 2015 and the second [name] infusion December 2016.   She has been compliant with the REMS program. With the exception of her TSH which has fluctuated quite a bit (she is on supplementation and has had thyroid surgery and is followed by endocrinology), the labs have been excellent.   Her last MRI of the brain 06/04/2016 did not show any compared to the MRI from 2016.  Physically, she feels stable.   Her gait is doing ok.  Arthritis bothers her gait more than MS.   She has mild weakness and spasticity in her legs.   She is using Ampyra with benefit.     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    She notes some numbness in her legs but it is not troublesome.   Bladder function is doing ok on Detrol and tamsulosin but was better last year.   She has seen [facility].   Vision is a little worse and she feels she needs new glasses.   She has mild diplopia with lateral gaze.      She feels she is always tired, physically and cognitively.   She sleeps ok  at night except mild night sweats.   She notes cognitive issues.   STM is reduced.   She also notes issues with verbal fluency, focus and attention.    Mood fluctuates and she is often irritable.     She gets frequent low grade migraines with nausea and photo/phonophobia.  These were worse as a teenager.   She has never taken a prophylactic agent.    Her daughter has had vertigo but the description sounds more like BPPV rather than a central etiology.     From 11/19/2016  MS:   She had her second year course of Lemtrada treatment December 2016. She tolerated it very well with no infusion reactions.   She has been participating well in the [place] program and labwork has been ok.   In September 2016, she had an MRI of  the brain for surveillance and she did have a little bit of breakthrough disease with 3 small enhancing lesions. She received 3 days of IV steroids. She never had any symptoms.    However.  Fall:    She fell 10/18/2016 when she tripped over a threshold.   She landed on her left knee and hand.   There were no fractures though her knee was bruised up and swollen.    She feels pretty much back to baseline at this point.      She works mostly at [employer] in [place] and incident occurred while she was leaving the office.   This is her only fall in 4 years of work.     Gait/strength/sensation:   Gait is better since starting Ampyra. Gait also improved after knee surgery in 2016 and 2017    Her right foot sometimes drags.  She has mild spasticity in both legs, helped by tizanidine though she stopped since she was sleepy.     Baclofen and tizanidine both made her sleepy but did help her spasticity.   But she stopped due to the side effects.  She reports mild burning in her feet, right > left, better than last visit.  Vision:  Vision is good.    She has mild diplopia if she looks to an extreme with head also turned.     Bladder:  She has some hesitancy helped by tamsulosin.  Frequency helped much by Detrol.    She no longer uses Depends.   Detrol dries her mouth  Fatigue:   She helps her fatigue and hypersomnolence. Dextroamphetamine helps better than Phentermine but the refills are a hassle.     Sleep:    She sleeps better with flexeril.  She gets about 5-6 hours nightly but wakes up 1-2 x to urinate.   She snores but husband has not noted gasping or snorting   She has RLS.   RLS occurs throughout the night, even when she moves legs.   Gabapentin helped but caused her to be sleepy and to hallucinate.   Requip caused confusion and she stopped.    She has sleep onset insomnia >> sleep maintenance issues  Mood/cognition:   Mood is doing ok in general.    She had mild depression.  Celexa helps but she is still moody.  No crying spells.   No anxiety.   She denies any significant difficulty with cognition. She works full time.  Thyroid:   She sees [facility]) and has h/o thyroidectomy for papillary carcinoma.   She is on Synthroid.      MS History:  She was diagnosed with relapsing remitting MS in 2004 after presenting with diplopia and vertigo. She had MRI/CSF c/w MS.   She had a five-day course of IV steroids.    Dr. Brett Fairy placed her on Rebif.  She had a couple of exacerbations while on Rebif with some effect on her gait.   She trandferred care to me.  She was switched to Copaxone plus estriol. Additionally, CellCept was tried for short while.  Unfortunately, she continued to have breakthrough exacerbations. Around 2008, she started Tysabri. She did fairly well on Tysabri with one definite exacerbation in 2012.    She was also JCV antibody positive. Therefore, she opted to switch to [place].   However, she continued to have exacerbations and disease activity on MRI and she switched to Tecfidera.   For about one year she was on Tecfidera but had a lot of GI side effects with nausea. Additionally she had breakthrough disease. December 2015 she received Lemtrada at 12 mg per day for 5 days. She tolerated the infusion fairly well with no significant systemic side effects or allergic reactions.   We had previously discussed the risk of autoimmune disease with Lemtrada. She is post operative with thyroidectomy due to thyroid cancer.   A little tissue remains but is being suppressed.   She is on supplementation.   MRI 06/05/15 showed 3 small enhancing lesions and she received several days IV steroid at that time.   She hd her second course of Lemtrada in December 2016 x 3 days.    REVIEW OF SYSTEMS:  Constitutional: No fevers, chills, sweats, or change in appetite.  She has fatigue and hypersomnia Eyes:  She notes double vision.   No eye pain Ear, nose and throat: No hearing loss, ear pain,  nasal congestion, sore throat Cardiovascular: No chest pain, palpitations Respiratory:  No shortness of breath at rest or with exertion.   No wheezes GastrointestinaI: No nausea, vomiting, diarrhea, abdominal pain, fecal incontinence Genitourinary:  see above. Musculoskeletal:  No neck pain, back pain Integumentary: No rash, pruritus, skin lesions Neurological: as above Psychiatric: No depression at this time (some in past)  No anxiety Endocrine: No palpitations, diaphoresis, change in appetite, change in weigh or increased thirst Hematologic/Lymphatic:  No anemia, purpura, petechiae. Allergic/Immunologic: No itchy/runny eyes, nasal congestion, recent allergic reactions, rashes  ALLERGIES: No Known Allergies  HOME MEDICATIONS: Outpatient Medications Prior to Visit  Medication Sig Dispense Refill  . Alemtuzumab (LEMTRADA) 12 MG/1.2ML SOLN Inject into the vein.    . citalopram (CELEXA) 40 MG tablet TAKE 1 TABLET(40 MG) BY MOUTH DAILY 90 tablet 0  . ibuprofen (ADVIL,MOTRIN) 800 MG tablet Take 800 mg by mouth every 6 (six) hours as needed for moderate pain.   0  . levothyroxine (SYNTHROID, LEVOTHROID) 175 MCG tablet Take 175 mcg by mouth daily before breakfast.    . omeprazole (PRILOSEC) 40 MG capsule Take 40 mg by mouth daily.   11  . tolterodine (DETROL LA) 4 MG 24 hr capsule Take 1 capsule (4 mg total) by mouth daily. 30 capsule 11  . topiramate (TOPAMAX) 50 MG tablet Take 2 tablets (100 mg total) by mouth 2 (two) times daily. 60 tablet 11  . AMPYRA 10 MG TB12 TAKE ONE TABLET BY MOUTH EVERY 12 HOURS 60 tablet 5  . dextroamphetamine (DEXEDRINE SPANSULE) 15 MG 24 hr capsule Take 3 capsules (45mg ) by mouth daily 90 capsule 0   Facility-Administered Medications Prior to Visit  Medication Dose Route Frequency Provider Last  Rate Last Dose  . gadopentetate dimeglumine (MAGNEVIST) injection 20 mL  20 mL Intravenous Once PRN Hartley Urton A, MD      . gadopentetate dimeglumine (MAGNEVIST)  injection 20 mL  20 mL Intravenous Once PRN Nyheim Seufert, Nanine Means, MD        PAST MEDICAL HISTORY: Past Medical History:  Diagnosis Date  . Anemia   . Arthritis   . Cancer ([place])   . Common migraine 06/16/2017  . Movement disorder   . Multiple sclerosis ([place])   . Thyroid cancer ([place])    2010  . Thyroid disease   . Vision abnormalities     PAST SURGICAL HISTORY: Past Surgical History:  Procedure Laterality Date  . IUD REMOVAL N/A 12/27/2014   Procedure: INTRAUTERINE DEVICE (IUD) REMOVAL;  Surgeon: Princess Bruins, MD;  Location: [facility];  Service: Gynecology;  Laterality: N/A;  . JOINT REPLACEMENT    . LAPAROSCOPY N/A 12/27/2014   Procedure: LAPAROSCOPY DIAGNOSTIC ;  Surgeon: Princess Bruins, MD;  Location: [facility];  Service: Gynecology;  Laterality: N/A;  . PATELLA RECONSTRUCTION    . THYROIDECTOMY      FAMILY HISTORY: Family History  Problem Relation Age of Onset  . Cancer Maternal Grandmother   . Cancer Maternal Grandfather   . Heart disease Paternal Grandmother   . Heart disease Paternal Grandfather   . Healthy Mother   . Thyroid cancer Father   . Healthy Brother   . Healthy Sister   . Healthy Sister     SOCIAL HISTORY:  Social History   Socioeconomic History  . Marital status: Married    Spouse name: Not on file  . Number of children: Not on file  . Years of education: Not on file  . Highest education level: Not on file  Occupational History  . Not on file  Social Needs  . Financial resource strain: Not on file  . Food insecurity:    Worry: Not on file    Inability: Not on file  . Transportation needs:    Medical: Not on file    Non-medical: Not on file  Tobacco Use  . Smoking status: Former Smoker    Last attempt to quit: 10/02/2003    Years since quitting: 14.2  . Smokeless tobacco: Never Used  Substance and Sexual Activity  . Alcohol use: No    Alcohol/week: 0.0 oz  . Drug use: No  . Sexual activity: Yes    Birth control/protection: Surgical    Lifestyle  . Physical activity:    Days per week: Not on file    Minutes per session: Not on file  . Stress: Not on file  Relationships  . Social connections:    Talks on phone: Not on file    Gets together: Not on file    Attends religious service: Not on file    Active member of club or organization: Not on file    Attends meetings of clubs or organizations: Not on file    Relationship status: Not on file  . Intimate partner violence:    Fear of current or ex partner: Not on file    Emotionally abused: Not on file    Physically abused: Not on file    Forced sexual activity: Not on file  Other Topics Concern  . Not on file  Social History Narrative  . Not on file     PHYSICAL EXAM  Vitals:   12/15/17 0823  BP: 122/75  Pulse: 97  Resp: 18  Weight: 248 lb (112.5 kg)  Height: 5\' 1"  (1.549 m)    Body mass index is 46.86 kg/m.   General: The patient is an overweight woman in no acute distress  Skin: Extremities are without significant edema.  Neurologic Exam  Mental status: The patient is alert and oriented x 3 at the time of the examination. The patient has apparent normal recent and remote memory, with an apparently normal attention span and concentration ability.   Speech is normal.  Cranial nerves: Extraocular movements are full but she had slight disconjugate gaze to far left gaze.  Color vision is reduced on the right.  Facial sensation is normal.  Facial sensation is normal trapezius strength is strong.  There is no dysarthria.  The tongue is midline, and the patient has symmetric elevation of the soft palate. No obvious hearing deficits are noted.  Motor:  Muscle bulk is normal.  She has increased muscle tone in the legs.  Strength is 5/5 in the arms and 4+/5 in the legs, slightly worse on the left she also has reduced  Sensory: Sensory testing shows allodynia in LFCN distribution on the right.  Sensation to temperature and vibration in the right arm and left  leg. Coordination: Cerebellar testing shows slightly reduced finger-nose-finger on the left.  She has poor heel -to-shin bilaterally.  Gait and station: Station is slightly wide. The gait is mildly wide in the tandem gait is poor.  The Romberg sign is mildly positive.   Reflexes: Deep tendon reflexes are symmetric and brisk bilaterally.    No ankle clonus.     DIAGNOSTIC DATA (LABS, IMAGING, TESTING) - I reviewed patient records, labs, notes, testing and imaging myself where available.    ASSESSMENT AND PLAN  Multiple sclerosis (HCC)  Abnormality of gait  High risk medication use  Chronic fatigue  Urinary hesitancy  Migraine without aura and without status migrainosus, not intractable    1.    Her MS has been fairly stable since starting [name].  She would need to have the REMS monitoring until December 2020.  She has been very compliant.  With exception of her thyroid labs (she has had surgery and is followed by endocrinology) labs have been good.  We will check another MRI later this year to make sure that she is not having subclinical progression that would make [place] do a third course of Lemtrada or start another medication.   2.    Dexadrine 45 mg daily 3.   Headaches are doing better.  If they worsen again, she will restart Topamax.   4.    Continue Detrol for the bladder spasms/frequency and add Rapaflo for hesitancy.   She has seen urology and will contact them if urinary function worsens.  5.    She will return to see me in 5-6 months or sooner if she has new or worsening neurologic symptoms.   Tristin Gladman A. Felecia Shelling, MD, PhD 0/0/9233, 0:07 AM Certified in Neurology, Clinical Neurophysiology, Sleep Medicine, Pain Medicine and Neuroimaging  [facility] [address] [place], [place] [address] [phone]

## 2017-12-17 ENCOUNTER — Other Ambulatory Visit: Payer: Self-pay | Admitting: Neurology

## 2017-12-26 ENCOUNTER — Encounter: Payer: Self-pay | Admitting: Neurology

## 2017-12-27 MED ORDER — ARIPIPRAZOLE 5 MG PO TABS: 5.0000 mg | ORAL_TABLET | Freq: Every day | ORAL | 0 refills | Status: DC

## 2018-01-02 ENCOUNTER — Encounter: Payer: Self-pay | Admitting: Neurology

## 2018-01-10 ENCOUNTER — Encounter: Payer: Self-pay | Admitting: Neurology

## 2018-01-11 ENCOUNTER — Other Ambulatory Visit: Payer: Self-pay | Admitting: *Deleted

## 2018-01-11 DIAGNOSIS — G35 Multiple sclerosis: Secondary | ICD-10-CM

## 2018-01-11 DIAGNOSIS — H543 Unqualified visual loss, both eyes: Secondary | ICD-10-CM

## 2018-01-12 ENCOUNTER — Telehealth: Payer: Self-pay | Admitting: Neurology

## 2018-01-12 NOTE — Telephone Encounter (Signed)
Patient had apt in the patient with Dr. Hassell Done but she did not show.  Patient has been scheduled again 05/17/2018 . At 10:00 am . Patient is also on CX list list this was Dr. Earlie Server first open slot. [facility] will call make patient aware of her apt. Thanks Hinton Dyer.

## 2018-01-18 ENCOUNTER — Encounter: Payer: Self-pay | Admitting: Neurology

## 2018-01-20 ENCOUNTER — Emergency Department (HOSPITAL_COMMUNITY)
Admission: EM | Admit: 2018-01-20 | Discharge: 2018-01-20 | Disposition: A | Discharge status: Home / Self Care | Payer: No Typology Code available for payment source | Attending: Emergency Medicine | Admitting: Emergency Medicine

## 2018-01-20 ENCOUNTER — Encounter (HOSPITAL_COMMUNITY): Payer: Self-pay | Admitting: Emergency Medicine

## 2018-01-20 ENCOUNTER — Emergency Department (HOSPITAL_COMMUNITY): Payer: No Typology Code available for payment source

## 2018-01-20 DIAGNOSIS — Y9389 Activity, other specified: Secondary | ICD-10-CM | POA: Insufficient documentation

## 2018-01-20 DIAGNOSIS — R0789 Other chest pain: Secondary | ICD-10-CM

## 2018-01-20 DIAGNOSIS — Z87891 Personal history of nicotine dependence: Secondary | ICD-10-CM | POA: Insufficient documentation

## 2018-01-20 DIAGNOSIS — Z79899 Other long term (current) drug therapy: Secondary | ICD-10-CM | POA: Diagnosis not present

## 2018-01-20 DIAGNOSIS — S60221A Contusion of right hand, initial encounter: Secondary | ICD-10-CM | POA: Insufficient documentation

## 2018-01-20 DIAGNOSIS — S6991XA Unspecified injury of right wrist, hand and finger(s), initial encounter: Secondary | ICD-10-CM | POA: Diagnosis present

## 2018-01-20 DIAGNOSIS — Z85850 Personal history of malignant neoplasm of thyroid: Secondary | ICD-10-CM | POA: Insufficient documentation

## 2018-01-20 DIAGNOSIS — Y92410 Unspecified street and highway as the place of occurrence of the external cause: Secondary | ICD-10-CM | POA: Diagnosis not present

## 2018-01-20 DIAGNOSIS — S8002XA Contusion of left knee, initial encounter: Secondary | ICD-10-CM | POA: Diagnosis not present

## 2018-01-20 DIAGNOSIS — Y998 Other external cause status: Secondary | ICD-10-CM | POA: Diagnosis not present

## 2018-01-20 MED ORDER — IBUPROFEN 600 MG PO TABS: 600.0000 mg | ORAL_TABLET | Freq: Three times a day (TID) | ORAL | 0 refills | Status: DC | PRN
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
## 2018-01-20 NOTE — ED Triage Notes (Signed)
Pt c/o shoulder pains and knee pains and burning sensations. Pt reports sharp pains on right side of breast
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
## 2018-01-23 ENCOUNTER — Other Ambulatory Visit: Payer: Self-pay | Admitting: Neurology

## 2018-01-30 ENCOUNTER — Encounter: Payer: Self-pay | Admitting: Neurology

## 2018-02-01 ENCOUNTER — Other Ambulatory Visit: Payer: Self-pay | Admitting: Neurology

## 2018-02-01 MED ORDER — DEXTROAMPHETAMINE SULFATE ER 15 MG PO CP24: ORAL_CAPSULE | ORAL | 0 refills | Status: DC

## 2018-02-03 MED ORDER — METHYLPREDNISOLONE 4 MG PO TABS: ORAL_TABLET | ORAL | 0 refills | Status: DC

## 2018-02-20 ENCOUNTER — Encounter: Payer: Self-pay | Admitting: Neurology

## 2018-02-25 DIAGNOSIS — G35 Multiple sclerosis: Secondary | ICD-10-CM | POA: Insufficient documentation

## 2018-03-02 ENCOUNTER — Encounter: Payer: Self-pay | Admitting: Obstetrics & Gynecology

## 2018-03-02 ENCOUNTER — Ambulatory Visit (INDEPENDENT_AMBULATORY_CARE_PROVIDER_SITE_OTHER): Payer: BLUE CROSS/BLUE SHIELD | Admitting: Obstetrics & Gynecology

## 2018-03-02 ENCOUNTER — Encounter: Payer: BLUE CROSS/BLUE SHIELD | Admitting: Obstetrics & Gynecology

## 2018-03-02 VITALS — BP 124/80 | Ht 61.5 in | Wt 248.0 lb

## 2018-03-02 DIAGNOSIS — Z9851 Tubal ligation status: Secondary | ICD-10-CM

## 2018-03-02 DIAGNOSIS — N945 Secondary dysmenorrhea: Secondary | ICD-10-CM

## 2018-03-02 DIAGNOSIS — N6320 Unspecified lump in the left breast, unspecified quadrant: Secondary | ICD-10-CM

## 2018-03-02 DIAGNOSIS — Z1151 Encounter for screening for human papillomavirus (HPV): Secondary | ICD-10-CM | POA: Diagnosis not present

## 2018-03-02 DIAGNOSIS — Z01419 Encounter for gynecological examination (general) (routine) without abnormal findings: Secondary | ICD-10-CM

## 2018-03-02 DIAGNOSIS — Z6841 Body Mass Index (BMI) 40.0 and over, adult: Secondary | ICD-10-CM

## 2018-03-02 DIAGNOSIS — N6310 Unspecified lump in the right breast, unspecified quadrant: Secondary | ICD-10-CM | POA: Diagnosis not present

## 2018-03-02 NOTE — Patient Instructions (Signed)
1. Encounter for routine gynecological examination with Papanicolaou smear of cervix Normal gynecologic exam.  Pap with high-risk HPV done today.  Breast exam with nodules, will organize bilateral diagnostic mammograms and ultrasound.  Health labs with family physician. - PAP,TP IMGw/HPV RNA,rflx [id number],81/82  2. Special screening examination for human papillomavirus (HPV)  - PAP,TP IMGw/HPV RNA,rflx [id number],89/38  3. Status post tubal ligation  4. Masses of both breasts Left breast nodule at 3:00 associated with tenderness and right breast nodule at 8:00 also associated with tenderness.  We will proceed with bilateral diagnostic mammogram and ultrasound.  5. Secondary dysmenorrhea Controlled with Mirena IUD.  Patient doing very well on it with only 2 episodes of breakthrough bleeding since insertion in January 2015.  Patient may come earlier than January 2020 for removal and insertion of a new Mirena IUD given that her husband has felt the strings as being painful with last intercourse.  6. Class 3 severe obesity due to excess calories without serious comorbidity with body mass index (BMI) of 45.0 to 49.9 in adult [facility]) Body mass index 46.10.  Recommend low calorie/low carb diet such as [employer] and regular physical activity with aerobic activities 5 times a week and weightlifting every 2 days.  Allison Guzman, it was a pleasure seeing you today!  I will inform you of your results as soon as they are available.

## 2018-03-02 NOTE — Progress Notes (Addendum)
Allison Guzman 21-Nov-1970 [id number]   History:    47 y.o. G38P2A2 Married.  S/P TL.  Children in [place].  RP:  New patient presenting for annual gyn exam   HPI: Well on Mirena IUD x 09/2013.  Only 2 episodes of BTB in 5 yrs.  No pelvic pain.  Normal vaginal secretions.  No pain with IC, but husband felt a sting from the IUD strings last IC.  Urine/BMs wnl.  Breasts wnl.  Last screening mammo 2 yrs ago.  BMI 46.10.  No regular fitness activities.  Health labs with Fam MD.  Hypothyroidism, MS, Thyroid Ca.  Past medical history,surgical history, family history and social history were all reviewed and documented in the EPIC chart.  Gynecologic History No LMP recorded. (Menstrual status: IUD). Contraception: S/P Tubal Ligation.  Mirena IUD x 09/2013 Last Pap: 5 yrs ago. Results were: normal per patient Last mammogram: 2 yrs ago. Results were: normal per patient Bone Density: Never Colonoscopy: Never  Obstetric History OB History  Gravida Para Term Preterm AB Living  2 2       2   SAB TAB Ectopic Multiple Live Births               # Outcome Date GA Lbr Len/2nd Weight Sex Delivery Anes PTL Lv  2 Para           1 Para              ROS: A ROS was performed and pertinent positives and negatives are included in the history.  GENERAL: No fevers or chills. HEENT: No change in vision, no earache, sore throat or sinus congestion. NECK: No pain or stiffness. CARDIOVASCULAR: No chest pain or pressure. No palpitations. PULMONARY: No shortness of breath, cough or wheeze. GASTROINTESTINAL: No abdominal pain, nausea, vomiting or diarrhea, melena or bright red blood per rectum. GENITOURINARY: No urinary frequency, urgency, hesitancy or dysuria. MUSCULOSKELETAL: No joint or muscle pain, no back pain, no recent trauma. DERMATOLOGIC: No rash, no itching, no lesions. ENDOCRINE: No polyuria, polydipsia, no heat or cold intolerance. No recent change in weight. HEMATOLOGICAL: No anemia or easy bruising or  bleeding. NEUROLOGIC: No headache, seizures, numbness, tingling or weakness. PSYCHIATRIC: No depression, no loss of interest in normal activity or change in sleep pattern.     Exam:   BP 124/80   Ht 5' 1.5" (1.562 m)   Wt 248 lb (112.5 kg)   BMI 46.10 kg/m   Body mass index is 46.1 kg/m.  General appearance : Well developed well nourished female. No acute distress HEENT: Eyes: no retinal hemorrhage or exudates,  Neck supple, trachea midline, no carotid bruits, no thyroidmegaly Lungs: Clear to auscultation, no rhonchi or wheezes, or rib retractions  Heart: Regular rate and rhythm, no murmurs or gallops Breast:Examined in sitting and supine position were symmetrical in appearance.  Rt breast: palpable nodule at 8 O'clock 2.0 cm with tenderness,  no skin retraction, no nipple inversion, no nipple discharge, no skin discoloration, no axillary or supraclavicular lymphadenopathy.  Lt breast:  Palpable nodule at 3 O'clock 1.5 cm with tenderness, no skin retraction, no nipple inversion, no nipple discharge, no skin discoloration, no axillary or supraclavicular lymphadenopathy. Abdomen: no palpable masses or tenderness, no rebound or guarding Extremities: no edema or skin discoloration or tenderness  Pelvic: Vulva: Normal             Vagina: No gross lesions or discharge  Cervix: No gross lesions or discharge.  Pap/HPV  HR done.  IUD strings visible.  Uterus  AV, normal size, shape and consistency, non-tender and mobile  Adnexa  Without masses or tenderness  Anus: Normal   Assessment/Plan:  47 y.o. female for annual exam   1. Encounter for routine gynecological examination with Papanicolaou smear of cervix Normal gynecologic exam.  Pap with high-risk HPV done today.  Breast exam with nodules, will organize bilateral diagnostic mammograms and ultrasound.  Health labs with family physician. - PAP,TP IMGw/HPV RNA,rflx [id number],97/74  2. Special screening examination for human papillomavirus  (HPV)  - PAP,TP IMGw/HPV RNA,rflx [id number],23/34  3. Status post tubal ligation  4. Masses of both breasts Left breast nodule at 3:00 associated with tenderness and right breast nodule at 8:00 also associated with tenderness.  We will proceed with bilateral diagnostic mammogram and ultrasound.  5. Secondary dysmenorrhea Controlled with Mirena IUD.  Patient doing very well on it with only 2 episodes of breakthrough bleeding since insertion in January 2015.  Patient may come earlier than January 2020 for removal and insertion of a new Mirena IUD given that her husband has felt the strings as being painful with last intercourse.  6. Class 3 severe obesity due to excess calories without serious comorbidity with body mass index (BMI) of 45.0 to 49.9 in adult [facility]) Body mass index 46.10.  Recommend low calorie/low carb diet such as [employer] and regular physical activity with aerobic activities 5 times a week and weightlifting every 2 days.  Counseling on above issues and coordination of care more than 50% for 10 minutes.  Princess Bruins MD, 3:25 PM 03/02/2018

## 2018-03-03 ENCOUNTER — Telehealth: Payer: Self-pay | Admitting: *Deleted

## 2018-03-03 DIAGNOSIS — N63 Unspecified lump in unspecified breast: Secondary | ICD-10-CM

## 2018-03-03 NOTE — Telephone Encounter (Signed)
-----   Message from Princess Bruins, MD sent at 03/02/2018  4:10 PM EDT ----- Regarding: Refer for Bilateral Diagnostic Mammo/US Left breast nodule at 3 O'clock, right breast nodule at 8 O'clock.

## 2018-03-03 NOTE — Telephone Encounter (Signed)
Patient scheduled on 03/15/18 @ 10:20am at breast center pt informed

## 2018-03-06 LAB — PAP, TP IMAGING W/ HPV RNA, RFLX HPV TYPE 16,18/45: HPV DNA HIGH RISK: NOT DETECTED

## 2018-03-07 MED ORDER — TAMSULOSIN HCL 0.4 MG PO CAPS: 0.4000 mg | ORAL_CAPSULE | Freq: Every day | ORAL | 0 refills | Status: DC

## 2018-03-15 ENCOUNTER — Ambulatory Visit
Admission: RE | Admit: 2018-03-15 | Discharge: 2018-03-15 | Disposition: A | Discharge status: Home / Self Care | Payer: BLUE CROSS/BLUE SHIELD | Source: Ambulatory Visit | Attending: Obstetrics & Gynecology | Admitting: Obstetrics & Gynecology

## 2018-03-15 ENCOUNTER — Encounter: Payer: Self-pay | Admitting: Neurology

## 2018-03-15 DIAGNOSIS — N63 Unspecified lump in unspecified breast: Secondary | ICD-10-CM

## 2018-03-21 ENCOUNTER — Encounter: Payer: Self-pay | Admitting: Neurology

## 2018-03-24 ENCOUNTER — Other Ambulatory Visit: Payer: Self-pay | Admitting: Neurology

## 2018-03-29 ENCOUNTER — Encounter: Payer: Self-pay | Admitting: Neurology

## 2018-03-29 ENCOUNTER — Other Ambulatory Visit: Payer: Self-pay

## 2018-03-29 ENCOUNTER — Ambulatory Visit (INDEPENDENT_AMBULATORY_CARE_PROVIDER_SITE_OTHER): Payer: BLUE CROSS/BLUE SHIELD | Admitting: Neurology

## 2018-03-29 ENCOUNTER — Ambulatory Visit: Payer: Self-pay | Admitting: Neurology

## 2018-03-29 VITALS — BP 127/86 | HR 106 | Resp 18 | Ht 61.5 in | Wt 252.5 lb

## 2018-03-29 DIAGNOSIS — R208 Other disturbances of skin sensation: Secondary | ICD-10-CM | POA: Diagnosis not present

## 2018-03-29 DIAGNOSIS — R269 Unspecified abnormalities of gait and mobility: Secondary | ICD-10-CM | POA: Diagnosis not present

## 2018-03-29 DIAGNOSIS — G35 Multiple sclerosis: Secondary | ICD-10-CM | POA: Diagnosis not present

## 2018-03-29 DIAGNOSIS — Z79899 Other long term (current) drug therapy: Secondary | ICD-10-CM

## 2018-03-29 DIAGNOSIS — R5382 Chronic fatigue, unspecified: Secondary | ICD-10-CM | POA: Diagnosis not present

## 2018-03-29 MED ORDER — IMIPRAMINE HCL 25 MG PO TABS: 25.0000 mg | ORAL_TABLET | Freq: Every day | ORAL | 5 refills | Status: DC

## 2018-03-29 MED ORDER — DALFAMPRIDINE ER 10 MG PO TB12: ORAL_TABLET | ORAL | 11 refills | Status: DC

## 2018-03-29 MED ORDER — DEXTROAMPHETAMINE SULFATE ER 15 MG PO CP24: ORAL_CAPSULE | ORAL | 0 refills | Status: DC

## 2018-03-29 NOTE — Progress Notes (Signed)
[facility]  PATIENT: Allison Guzman DOB: 1970/09/28  REFERRING CLINICIAN:  Heather Spry HISTORY FROM:  patient REASON FOR VISIT:  Multiple sclerosis.   HISTORICAL  CHIEF COMPLAINT:  Chief Complaint  Patient presents with  . Multiple Sclerosis    2nd yr. Lemtrada infusions were done Dec. 12-14, 2016.  Here today with new c/o burning pain in bilat feet, right worse than left, onset 5-6 mos. ago and getting worse. Lying down makes pian worse and standing makes pain better/fim    HISTORY OF PRESENT ILLNESS:  [name] is a 47 year old woman with relapsing remitting MS.    Update 03/29/2018: She has had progressively worsening pain in her right leg over the last 6 months, much worse the last month.  .   Tingling goes up to the clf and can be painful.   She also feels her foot is swollen.   She notes it worsens with prolonged sitting and better when she stands up.     Of note, she has had some numbness in her right leg and also drags the right leg for a long time.     Gait is about the same as last visit.   She can walk without a cane though the right leg drags as she walks longer.    She does worse in heat.   The  Legs are spastic, right more than left.   Bladder function is ok on Detrol and tamsulosin.   She has nocturia x 4 at night.     Her fatigue is improved on Dexedrine and she tolerates it well.    She had   She had her second Lemtrada infusion in 2016 and has done well.       Update 12/15/2017: Her MS is generally doing well.  The second course of Lemtrada infusions were December 2016.  She is compliant with the REMS program.  Her TSH has fluctuated quite a bit but she is under the care of an endocrinologist and she has had thyroid surgery in the past.  Other labs are fine.  She has not had any exacerbations.  She feels her gait is stable and is a little bit off balance.  There is mild weakness and spasticity in the legs. She feels she no longer on Ampyra but  it helped earlier.   Sometimes she notes some numbness in the legs.  She has urinary frequency and urgency helped by Detrol and tamsulosin.  She has seen urology.  She notes no new issues with her vision.  She has had nystagmus and diplopia in the past but now only to right gaze.  Colors are desaturated on the right.   Continues to experience fatigue that is both physical and cognitive.  Dexedrine has helped some.  She generally sleeps well at night.  She has had some mood irritability and is sometimes agitated but denies frank depression.  Celexa has helped.  Cognition is stable with mild issues with short-term memory, focus, attention and verbal fluency.  Dexedrine has helped some of this.  Migraines are not too bad since a new pair of glasses and she stopped Topamax.  The MRI of the brain performed 06/30/2017 was personally reviewed and it showed typical MS lesions but there were no new or enlarging lesions.  Lab work was reviewed and summarized above  Update 06/16/2017:    She had her first [name] infusion November 2015 and the second [name] infusion December 2016.   She has been  compliant with the REMS program. With the exception of her TSH which has fluctuated quite a bit (she is on supplementation and has had thyroid surgery and is followed by endocrinology), the labs have been excellent.   Her last MRI of the brain 06/04/2016 did not show any compared to the MRI from 2016.  Physically, she feels stable.   Her gait is doing ok.  Arthritis bothers her gait more than MS.   She has mild weakness and spasticity in her legs.   She is using Ampyra with benefit.     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    She notes some numbness in her legs but it is not troublesome.   Bladder function is doing ok on Detrol and tamsulosin but was better last year.   She has seen [facility].   Vision is a little worse and she feels she needs new glasses.   She has mild diplopia with  lateral gaze.      She feels she is always tired, physically and cognitively.   She sleeps ok at night except mild night sweats.   She notes cognitive issues.   STM is reduced.   She also notes issues with verbal fluency, focus and attention.    Mood fluctuates and she is often irritable.     She gets frequent low grade migraines with nausea and photo/phonophobia.  These were worse as a teenager.   She has never taken a prophylactic agent.    Her daughter has had vertigo but the description sounds more like BPPV rather than a central etiology.     From 11/19/2016  MS:   She had her second year course of Lemtrada treatment December 2016. She tolerated it very well with no infusion reactions.   She has been participating well in the [facility] program and labwork has been ok.   In September 2016, she had an MRI of the brain for surveillance and she did have a little bit of breakthrough disease with 3 small enhancing lesions. She received 3 days of IV steroids. She never had any symptoms.    However.  Fall:    She fell 10/18/2016 when she tripped over a threshold.   She landed on her left knee and hand.   There were no fractures though her knee was bruised up and swollen.    She feels pretty much back to baseline at this point.      She works mostly at [employer] in [place] and incident occurred while she was leaving the office.   This is her only fall in 4 years of work.     Gait/strength/sensation:   Gait is better since starting Ampyra. Gait also improved after knee surgery in 2016 and 2017    Her right foot sometimes drags.  She has mild spasticity in both legs, helped by tizanidine though she stopped since she was sleepy.     Baclofen and tizanidine both made her sleepy but did help her spasticity.   But she stopped due to the side effects.  She reports mild burning in her feet, right > left, better than last visit.  Vision:  Vision is good.    She has mild diplopia if she looks to an extreme with head  also turned.     Bladder:  She has some hesitancy helped by tamsulosin.  Frequency helped much by Detrol.    She no longer uses Depends.  Detrol dries her mouth  Fatigue:   She helps her fatigue and hypersomnolence. Dextroamphetamine helps better than Phentermine but the refills are a hassle.     Sleep:    She sleeps better with flexeril.  She gets about 5-6 hours nightly but wakes up 1-2 x to urinate.   She snores but husband has not noted gasping or snorting   She has RLS.   RLS occurs throughout the night, even when she moves legs.   Gabapentin helped but caused her to be sleepy and to hallucinate.   Requip caused confusion and she stopped.    She has sleep onset insomnia >> sleep maintenance issues  Mood/cognition:   Mood is doing ok in general.    She had mild depression.  Celexa helps but she is still moody. No crying spells.   No anxiety.   She denies any significant difficulty with cognition. She works full time.  Thyroid:   She sees [facility]) and has h/o thyroidectomy for papillary carcinoma.   She is on Synthroid.      MS History:   She was diagnosed with relapsing remitting MS in 2004 after presenting with diplopia and vertigo. She had MRI/CSF c/w MS.   She had a five-day course of IV steroids.    Dr. Brett Fairy placed her on Rebif.  She had a couple of exacerbations while on Rebif with some effect on her gait.   She trandferred care to me.  She was switched to Copaxone plus estriol. Additionally, CellCept was tried for short while.  Unfortunately, she continued to have breakthrough exacerbations. Around 2008, she started Tysabri. She did fairly well on Tysabri with one definite exacerbation in 2012.    She was also JCV antibody positive. Therefore, she opted to switch to [place].   However, she continued to have exacerbations and disease activity on MRI and she switched to Tecfidera.   For about one year she was on Tecfidera but had a lot of GI side effects with  nausea. Additionally she had breakthrough disease. December 2015 she received Lemtrada at 12 mg per day for 5 days. She tolerated the infusion fairly well with no significant systemic side effects or allergic reactions.   We had previously discussed the risk of autoimmune disease with Lemtrada. She is post operative with thyroidectomy due to thyroid cancer.   A little tissue remains but is being suppressed.   She is on supplementation.   MRI 06/05/15 showed 3 small enhancing lesions and she received several days IV steroid at that time.   She hd her second course of Lemtrada in December 2016 x 3 days.    REVIEW OF SYSTEMS:  Constitutional: No fevers, chills, sweats, or change in appetite.  She has fatigue and hypersomnia Eyes:  She notes double vision.   No eye pain Ear, nose and throat: No hearing loss, ear pain, nasal congestion, sore throat Cardiovascular: No chest pain, palpitations Respiratory:  No shortness of breath at rest or with exertion.   No wheezes GastrointestinaI: No nausea, vomiting, diarrhea, abdominal pain, fecal incontinence Genitourinary:  see above. Musculoskeletal:  No neck pain, back pain Integumentary: No rash, pruritus, skin lesions Neurological: as above Psychiatric: No depression at this time (some in past)  No anxiety Endocrine: No palpitations, diaphoresis, change in appetite, change in weigh or increased thirst Hematologic/Lymphatic:  No anemia, purpura, petechiae. Allergic/Immunologic: No itchy/runny eyes, nasal congestion, recent allergic reactions, rashes  ALLERGIES: Allergies  Allergen Reactions  . Penicillins  HOME MEDICATIONS: Outpatient Medications Prior to Visit  Medication Sig Dispense Refill  . ARIPiprazole (ABILIFY) 5 MG tablet TAKE 1 TABLET(5 MG) BY MOUTH DAILY 90 tablet 0  . citalopram (CELEXA) 40 MG tablet TAKE 1 TABLET(40 MG) BY MOUTH DAILY 90 tablet 0  . colchicine 0.6 MG tablet Take 0.6 mg by mouth 2 (two) times daily as needed.  3  .  ibuprofen (ADVIL,MOTRIN) 600 MG tablet Take 1 tablet (600 mg total) by mouth every 8 (eight) hours as needed for moderate pain. 30 tablet 0  . omeprazole (PRILOSEC) 40 MG capsule Take 40 mg by mouth daily.   11  . tamsulosin (FLOMAX) 0.4 MG CAPS capsule Take 1 capsule (0.4 mg total) by mouth daily. 90 capsule 0  . TIROSINT 88 MCG CAPS Take 176 mcg by mouth daily.  1  . tolterodine (DETROL LA) 4 MG 24 hr capsule TAKE 1 CAPSULE(4 MG) BY MOUTH DAILY 90 capsule 1  . dextroamphetamine (DEXEDRINE SPANSULE) 15 MG 24 hr capsule Take 3 capsules (45mg ) by mouth daily 90 capsule 0  . silodosin (RAPAFLO) 8 MG CAPS capsule Take 1 capsule (8 mg total) by mouth daily with breakfast. (Patient not taking: Reported on 03/29/2018) 30 capsule 5   Facility-Administered Medications Prior to Visit  Medication Dose Route Frequency Provider Last Rate Last Dose  . gadopentetate dimeglumine (MAGNEVIST) injection 20 mL  20 mL Intravenous Once PRN Amory Zbikowski, Nanine Means, MD        PAST MEDICAL HISTORY: Past Medical History:  Diagnosis Date  . Anemia   . Arthritis   . Cancer ([place])   . Common migraine 06/16/2017  . Movement disorder   . Multiple sclerosis ([place])   . Thyroid cancer ([place])    2010  . Thyroid disease   . Vision abnormalities     PAST SURGICAL HISTORY: Past Surgical History:  Procedure Laterality Date  . CESAREAN SECTION    . IUD REMOVAL N/A 12/27/2014   Procedure: INTRAUTERINE DEVICE (IUD) REMOVAL;  Surgeon: Princess Bruins, MD;  Location: [facility];  Service: Gynecology;  Laterality: N/A;  . JOINT REPLACEMENT    . KNEE SURGERY    . LAPAROSCOPY N/A 12/27/2014   Procedure: LAPAROSCOPY DIAGNOSTIC ;  Surgeon: Princess Bruins, MD;  Location: [facility];  Service: Gynecology;  Laterality: N/A;  . PATELLA RECONSTRUCTION    . THYROIDECTOMY      FAMILY HISTORY: Family History  Problem Relation Age of Onset  . Breast cancer Maternal Grandmother 49  . Cancer Maternal Grandfather        bone   . Heart disease  Paternal Grandmother   . Heart disease Paternal Grandfather   . Healthy Mother   . Cancer Father        throat   . Healthy Brother   . Healthy Sister   . Healthy Sister     SOCIAL HISTORY:  Social History   Socioeconomic History  . Marital status: Married    Spouse name: Not on file  . Number of children: Not on file  . Years of education: Not on file  . Highest education level: Not on file  Occupational History  . Not on file  Social Needs  . Financial resource strain: Not on file  . Food insecurity:    Worry: Not on file    Inability: Not on file  . Transportation needs:    Medical: Not on file    Non-medical: Not on file  Tobacco Use  . Smoking status: Former  Smoker    Last attempt to quit: 10/02/2003    Years since quitting: 14.4  . Smokeless tobacco: Never Used  Substance and Sexual Activity  . Alcohol use: No    Alcohol/week: 0.0 oz  . Drug use: No  . Sexual activity: Yes    Partners: Male    Birth control/protection: Surgical    Comment: 1st intercourse- 16, partners- 67, married- 11 yrs   Lifestyle  . Physical activity:    Days per week: Not on file    Minutes per session: Not on file  . Stress: Not on file  Relationships  . Social connections:    Talks on phone: Not on file    Gets together: Not on file    Attends religious service: Not on file    Active member of club or organization: Not on file    Attends meetings of clubs or organizations: Not on file    Relationship status: Not on file  . Intimate partner violence:    Fear of current or ex partner: Not on file    Emotionally abused: Not on file    Physically abused: Not on file    Forced sexual activity: Not on file  Other Topics Concern  . Not on file  Social History Narrative  . Not on file     PHYSICAL EXAM  Vitals:   03/29/18 0848  BP: 127/86  Pulse: (!) 106  Resp: 18  Weight: 252 lb 8 oz (114.5 kg)  Height: 5' 1.5" (1.562 m)    Body mass index is 46.94  kg/m.   General: The patient is an overweight woman in no acute distress  Skin: Extremities show mild pedal edema, right greater than left.  There is a little tenderness to deep palpation patient of the right calf.  There is no warmth or redness  Neurologic Exam  Mental status: The patient is alert and oriented x 3 at the time of the examination. The patient has apparent normal recent and remote memory, with an apparently normal attention span and concentration ability.   Speech is normal.  Cranial nerves: There is mild disconjugate gaze looking for left..  Color vision is reduced on the right.  Facial strength and sensation is normal.  Trapezius strength is normal.  There is no dysarthria.  The tongue is midline, and the patient has symmetric elevation of the soft palate. No obvious hearing deficits are noted.  Motor:  Muscle bulk is normal.  She has increased muscle tone in the legs.  Strength is 5/5 in the arms and 4+/5 in the legs, slightly worse on the left she also has reduced  Sensory: Sensory testing shows allodynia in LFCN distribution on the right and also some in the right foot.  She has reduced vibration sensation on the right relative to the left.  Coordination: Cerebellar testing shows slightly reduced finger-nose-finger on the left.  She has poor heel -to-shin bilaterally.  Gait and station: Station is slightly wide.  Her gait is mildly wide and also arthritic.  She has difficulty doing a tandem walk.  The Romberg sign is mildly positive.   Reflexes: Deep tendon reflexes are symmetric and brisk bilaterally.    No ankle clonus.     DIAGNOSTIC DATA (LABS, IMAGING, TESTING) - I reviewed patient records, labs, notes, testing and imaging myself where available.    ASSESSMENT AND PLAN  Multiple sclerosis (H[facility])  Abnormality of gait  High risk medication use  Chronic fatigue  Dysesthesia  1.    She continues to be stable on [name].  She will need to do the REMS  program until December 2020.  She has not had any recent exacerbations. 2.    Dexadrine 45 mg daily for fatigue 3.   Imipramine for dysesthesia and nocturia --- if not well tolerated, switch tot gabapentin 4.    Continue Detrol for the bladder spasms/frequency and tamsulosin for hesitancy.  Return to urology if symptoms worsen  5.    She will return to see me in 5-6 months or sooner if she has new or worsening neurologic symptoms.   Nastassja Witkop A. Felecia Shelling, MD, PhD 6/85/4883, 0:14 AM Certified in Neurology, Clinical Neurophysiology, Sleep Medicine, Pain Medicine and Neuroimaging  [facility] [address] [place], [place] [address] [phone]

## 2018-03-30 ENCOUNTER — Encounter: Payer: Self-pay | Admitting: Neurology

## 2018-04-05 ENCOUNTER — Encounter: Payer: Self-pay | Admitting: Neurology

## 2018-04-05 ENCOUNTER — Telehealth: Payer: Self-pay | Admitting: *Deleted

## 2018-04-05 MED ORDER — DALFAMPRIDINE ER 10 MG PO TB12: ORAL_TABLET | ORAL | 3 refills | Status: DC

## 2018-04-05 NOTE — Telephone Encounter (Signed)
Submitted [place] dalfampridine 10mg  TB12 on covermymeds. Key: [id number] - Rx #: [id number]. Waiting on determination.   "Your information has been submitted to [facility]. [employer] will review the request and fax you a determination directly, typically within 3 business days of your submission once all necessary information is received. If [employer] has not responded in 3 business days or if you have any questions about your submission, contact [facility] at [phone]."

## 2018-04-06 ENCOUNTER — Encounter: Payer: Self-pay | Admitting: Neurology

## 2018-04-06 NOTE — Telephone Encounter (Signed)
Received fax from [facility] that [place] already on file for dalfampridine. Pt can only fill for 30 day supply at a time for specialty medication. Nothing further needed.

## 2018-04-10 ENCOUNTER — Other Ambulatory Visit: Payer: Self-pay | Admitting: *Deleted

## 2018-04-19 ENCOUNTER — Encounter: Payer: Self-pay | Admitting: Neurology

## 2018-05-02 ENCOUNTER — Other Ambulatory Visit: Payer: Self-pay | Admitting: Neurology

## 2018-05-11 ENCOUNTER — Other Ambulatory Visit: Payer: Self-pay

## 2018-05-11 ENCOUNTER — Emergency Department (HOSPITAL_COMMUNITY): Payer: BLUE CROSS/BLUE SHIELD

## 2018-05-11 ENCOUNTER — Encounter (HOSPITAL_COMMUNITY): Payer: Self-pay | Admitting: *Deleted

## 2018-05-11 ENCOUNTER — Emergency Department (HOSPITAL_COMMUNITY)
Admission: EM | Admit: 2018-05-11 | Discharge: 2018-05-11 | Disposition: A | Discharge status: Home / Self Care | Payer: BLUE CROSS/BLUE SHIELD | Attending: Emergency Medicine | Admitting: Emergency Medicine

## 2018-05-11 DIAGNOSIS — R109 Unspecified abdominal pain: Secondary | ICD-10-CM | POA: Diagnosis not present

## 2018-05-11 DIAGNOSIS — W2210XA Striking against or struck by unspecified automobile airbag, initial encounter: Secondary | ICD-10-CM | POA: Diagnosis not present

## 2018-05-11 DIAGNOSIS — G501 Atypical facial pain: Secondary | ICD-10-CM | POA: Diagnosis not present

## 2018-05-11 DIAGNOSIS — Y998 Other external cause status: Secondary | ICD-10-CM | POA: Insufficient documentation

## 2018-05-11 DIAGNOSIS — Y92410 Unspecified street and highway as the place of occurrence of the external cause: Secondary | ICD-10-CM | POA: Diagnosis not present

## 2018-05-11 DIAGNOSIS — Y9389 Activity, other specified: Secondary | ICD-10-CM | POA: Insufficient documentation

## 2018-05-11 DIAGNOSIS — R110 Nausea: Secondary | ICD-10-CM | POA: Insufficient documentation

## 2018-05-11 DIAGNOSIS — E039 Hypothyroidism, unspecified: Secondary | ICD-10-CM | POA: Diagnosis not present

## 2018-05-11 DIAGNOSIS — Z85850 Personal history of malignant neoplasm of thyroid: Secondary | ICD-10-CM | POA: Insufficient documentation

## 2018-05-11 DIAGNOSIS — T07XXXA Unspecified multiple injuries, initial encounter: Secondary | ICD-10-CM

## 2018-05-11 DIAGNOSIS — Z87891 Personal history of nicotine dependence: Secondary | ICD-10-CM | POA: Diagnosis not present

## 2018-05-11 DIAGNOSIS — M79621 Pain in right upper arm: Secondary | ICD-10-CM | POA: Insufficient documentation

## 2018-05-11 DIAGNOSIS — M542 Cervicalgia: Secondary | ICD-10-CM | POA: Diagnosis not present

## 2018-05-11 DIAGNOSIS — Z79899 Other long term (current) drug therapy: Secondary | ICD-10-CM | POA: Diagnosis not present

## 2018-05-11 LAB — COMPREHENSIVE METABOLIC PANEL
ALT: 16 U/L (ref 0–44)
AST: 20 U/L (ref 15–41)
Albumin: 3.7 g/dL (ref 3.5–5.0)
Alkaline Phosphatase: 71 U/L (ref 38–126)
Anion gap: 12 (ref 5–15)
BUN: 11 mg/dL (ref 6–20)
CHLORIDE: 103 mmol/L (ref 98–111)
CO2: 24 mmol/L (ref 22–32)
Calcium: 9.2 mg/dL (ref 8.9–10.3)
Creatinine, Ser: 0.87 mg/dL (ref 0.44–1.00)
Glucose, Bld: 107 mg/dL — ABNORMAL HIGH (ref 70–99)
POTASSIUM: 3.8 mmol/L (ref 3.5–5.1)
Sodium: 139 mmol/L (ref 135–145)
Total Bilirubin: 0.6 mg/dL (ref 0.3–1.2)
Total Protein: 7.2 g/dL (ref 6.5–8.1)

## 2018-05-11 LAB — I-STAT CREATININE, ED: Creatinine, Ser: 0.8 mg/dL (ref 0.44–1.00)

## 2018-05-11 LAB — CBC
HEMATOCRIT: 38.3 % (ref 36.0–46.0)
Hemoglobin: 12.5 g/dL (ref 12.0–15.0)
MCH: 27.5 pg (ref 26.0–34.0)
MCHC: 32.6 g/dL (ref 30.0–36.0)
MCV: 84.2 fL (ref 78.0–100.0)
Platelets: 235 10*3/uL (ref 150–400)
RBC: 4.55 MIL/uL (ref 3.87–5.11)
RDW: 15.6 % — ABNORMAL HIGH (ref 11.5–15.5)
WBC: 7.1 10*3/uL (ref 4.0–10.5)

## 2018-05-11 LAB — I-STAT BETA HCG BLOOD, ED (MC, WL, AP ONLY): I-stat hCG, quantitative: <5 m[IU]/mL (ref <5)
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
## 2018-05-11 MED ORDER — ONDANSETRON HCL 4 MG/2ML IJ SOLN
4.0000 mg | Freq: Once | INTRAMUSCULAR | Status: AC
  Administered 2018-05-11: 4 mg via INTRAVENOUS
  Filled 2018-05-11: qty 2

## 2018-05-11 MED ORDER — IOPAMIDOL (ISOVUE-300) INJECTION 61%
INTRAVENOUS | Status: AC
  Filled 2018-05-11: qty 100

## 2018-05-11 MED ORDER — IOPAMIDOL (ISOVUE-300) INJECTION 61%
100.0000 mL | Freq: Once | INTRAVENOUS | Status: AC | PRN
  Administered 2018-05-11: 100 mL via INTRAVENOUS
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
## 2018-05-11 NOTE — ED Notes (Addendum)
Pt unable to sign for d/c due to topaz being out of order. Per pt, pt agrees to discharge plan and has no further questions.

## 2018-05-19 ENCOUNTER — Encounter: Payer: Self-pay | Admitting: Neurology

## 2018-05-29 ENCOUNTER — Encounter: Payer: Self-pay | Admitting: Neurology

## 2018-05-29 ENCOUNTER — Other Ambulatory Visit: Payer: Self-pay

## 2018-05-29 ENCOUNTER — Ambulatory Visit (INDEPENDENT_AMBULATORY_CARE_PROVIDER_SITE_OTHER): Payer: BLUE CROSS/BLUE SHIELD | Admitting: Neurology

## 2018-05-29 VITALS — BP 132/81 | HR 94 | Resp 20 | Ht 62.0 in | Wt 255.0 lb

## 2018-05-29 DIAGNOSIS — G44309 Post-traumatic headache, unspecified, not intractable: Secondary | ICD-10-CM | POA: Diagnosis not present

## 2018-05-29 DIAGNOSIS — R269 Unspecified abnormalities of gait and mobility: Secondary | ICD-10-CM

## 2018-05-29 DIAGNOSIS — Z79899 Other long term (current) drug therapy: Secondary | ICD-10-CM | POA: Diagnosis not present

## 2018-05-29 DIAGNOSIS — G43009 Migraine without aura, not intractable, without status migrainosus: Secondary | ICD-10-CM

## 2018-05-29 DIAGNOSIS — G35 Multiple sclerosis: Secondary | ICD-10-CM

## 2018-05-29 DIAGNOSIS — E890 Postprocedural hypothyroidism: Secondary | ICD-10-CM

## 2018-05-29 MED ORDER — DEXTROAMPHETAMINE SULFATE ER 15 MG PO CP24: ORAL_CAPSULE | ORAL | 0 refills | Status: DC
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
## 2018-05-30 ENCOUNTER — Telehealth: Payer: Self-pay | Admitting: Neurology

## 2018-05-30 NOTE — Telephone Encounter (Signed)
lvm for pt to call back about scheduling mri  [employer] Auth: [id number] (exp. 05/30/18 to 06/28/18)  Novella Rob: [id number] 9exp. 05/30/18 to 08/28/18)

## 2018-05-31 ENCOUNTER — Other Ambulatory Visit: Payer: Self-pay | Admitting: Neurology

## 2018-06-06 NOTE — Telephone Encounter (Signed)
Patient returned my call she is scheduled for 06/07/18 at [facility].

## 2018-06-07 ENCOUNTER — Ambulatory Visit (INDEPENDENT_AMBULATORY_CARE_PROVIDER_SITE_OTHER): Payer: BLUE CROSS/BLUE SHIELD

## 2018-06-07 DIAGNOSIS — G44309 Post-traumatic headache, unspecified, not intractable: Secondary | ICD-10-CM

## 2018-06-07 DIAGNOSIS — G35 Multiple sclerosis: Secondary | ICD-10-CM | POA: Diagnosis not present

## 2018-06-07 MED ORDER — GADOBENATE DIMEGLUMINE 529 MG/ML IV SOLN
20.0000 mL | Freq: Once | INTRAVENOUS | Status: AC | PRN
  Administered 2018-06-07: 20 mL via INTRAVENOUS

## 2018-06-12 ENCOUNTER — Telehealth: Payer: Self-pay | Admitting: *Deleted

## 2018-06-12 NOTE — Telephone Encounter (Signed)
-----   Message from Britt Bottom, MD sent at 06/11/2018  7:34 PM EDT ----- Please let her know that the MRI of the brain did not show any new lesions.

## 2018-06-12 NOTE — Telephone Encounter (Signed)
Spoke with [facility] and reviewed below MRI results.  She verbalized understanding of same/fim

## 2018-06-16 ENCOUNTER — Ambulatory Visit: Payer: BLUE CROSS/BLUE SHIELD | Admitting: Neurology

## 2018-06-18 ENCOUNTER — Other Ambulatory Visit: Payer: Self-pay | Admitting: Neurology

## 2018-06-19 ENCOUNTER — Encounter: Payer: Self-pay | Admitting: Neurology

## 2018-06-19 ENCOUNTER — Other Ambulatory Visit: Payer: Self-pay | Admitting: Neurology

## 2018-06-22 ENCOUNTER — Ambulatory Visit (INDEPENDENT_AMBULATORY_CARE_PROVIDER_SITE_OTHER): Payer: BLUE CROSS/BLUE SHIELD | Admitting: Obstetrics & Gynecology

## 2018-06-22 ENCOUNTER — Encounter: Payer: Self-pay | Admitting: Obstetrics & Gynecology

## 2018-06-22 VITALS — BP 130/86

## 2018-06-22 DIAGNOSIS — Z30433 Encounter for removal and reinsertion of intrauterine contraceptive device: Secondary | ICD-10-CM

## 2018-06-22 DIAGNOSIS — N920 Excessive and frequent menstruation with regular cycle: Secondary | ICD-10-CM | POA: Diagnosis not present

## 2018-06-22 DIAGNOSIS — N951 Menopausal and female climacteric states: Secondary | ICD-10-CM

## 2018-06-22 NOTE — Patient Instructions (Signed)
1. Encounter for removal and reinsertion of intrauterine contraceptive device (IUD) Time to remove the Mirena IUD.  Easy removal.  Insertion of a new Mirena IUD for cycle control.  Easy insertion with no complication.  Follow-up in 4 weeks to check IUD.  Precautions discussed with patient.  2. Hot flushes, perimenopausal Occasional hot flushes.  Will verify menopausal status. [facility]  Manon Hilding, it was a pleasure seeing you today!  I will inform you of your results as soon as they are available.

## 2018-06-22 NOTE — Progress Notes (Signed)
    Allison Guzman 11-11-1970 [id number]        47 y.o.  G2P2L2 S/P TL  RP: Removal/Insertion of Mirena IUD  HPI: Mirena IUD to control the menstrual cycle.  No bleeding with Mirena IUD.  No pelvic pain.  Time for removal and insertion of a new one.   OB History  Gravida Para Term Preterm AB Living  2 2       2   SAB TAB Ectopic Multiple Live Births               # Outcome Date GA Lbr Len/2nd Weight Sex Delivery Anes PTL Lv  2 Para           1 Para             Past medical history,surgical history, problem list, medications, allergies, family history and social history were all reviewed and documented in the EPIC chart.   Directed ROS with pertinent positives and negatives documented in the history of present illness/assessment and plan.  Exam:  Vitals:   06/22/18 1513  BP: 130/86   General appearance:  Normal                                                                    IUD procedure note       Patient presented to the office today for removal and placement of Mirena IUD. The patient had previously been provided with literature information on this method of contraception. The risks benefits and pros and cons were discussed and all her questions were answered. She is fully aware that this form of contraception is 99% effective and is good for 5 years.  Pelvic exam: Vulva normal Vagina: No lesions or discharge Cervix: No lesions or discharge Uterus: RV position Adnexa: No masses or tenderness Rectal exam: Not done  The cervix was cleansed with Betadine solution. Hurricane spray on the cervix.  A single-tooth tenaculum was placed on the anterior cervical lip.  Easy removal of the Mirena IUD by pulling on strings with a fenestrated clamp.  IUD intact/complete, shown to patient and discarded.  Os finder dilation of the cerivx.The IUD was shown to the patient and inserted in a sterile fashion.  Hysterometry with the IUD as being inserted was 8 cm.  The IUD string was  trimmed. The single-tooth tenaculum was removed. Patient was instructed to return back to the office in one month for follow up.        Assessment/Plan:  47 y.o. G2P2   1. Encounter for removal and reinsertion of intrauterine contraceptive device (IUD) Time to remove the Mirena IUD.  Easy removal.  Insertion of a new Mirena IUD for cycle control.  Easy insertion with no complication.  Follow-up in 4 weeks to check IUD.  Precautions discussed with patient.  2. Hot flushes, perimenopausal Occasional hot flushes.  Will verify menopausal status. [facility]  Counseling on above issues and coordination of care more than 50% for 10 minutes.  Princess Bruins MD, 3:40 PM 06/22/2018

## 2018-06-23 ENCOUNTER — Encounter: Payer: Self-pay | Admitting: *Deleted

## 2018-06-23 LAB — FOLLICLE STIMULATING HORMONE: FSH: 4.8 m[IU]/mL

## 2018-06-26 ENCOUNTER — Encounter: Payer: Self-pay | Admitting: Anesthesiology

## 2018-07-04 ENCOUNTER — Other Ambulatory Visit: Payer: Self-pay | Admitting: *Deleted

## 2018-07-04 MED ORDER — DEXTROAMPHETAMINE SULFATE ER 15 MG PO CP24: ORAL_CAPSULE | ORAL | 0 refills | Status: DC

## 2018-07-04 MED ORDER — BACLOFEN 10 MG PO TABS: ORAL_TABLET | ORAL | 0 refills | Status: DC

## 2018-07-06 ENCOUNTER — Telehealth: Payer: Self-pay | Admitting: *Deleted

## 2018-07-06 NOTE — Telephone Encounter (Signed)
Faxed completed/signed Ampyra start form to [phone]. Received fax confirmation.

## 2018-07-10 ENCOUNTER — Other Ambulatory Visit: Payer: Self-pay | Admitting: Neurology

## 2018-07-20 ENCOUNTER — Encounter: Payer: Self-pay | Admitting: Neurology

## 2018-07-21 NOTE — Telephone Encounter (Signed)
I called Ampyra support to check on status of getting pt medication. They see rx was forwarded to [facility] but do not see where it has been processed. They are going to contact the case manager to get an update on this and let our office know.

## 2018-07-25 ENCOUNTER — Encounter: Payer: Self-pay | Admitting: Obstetrics & Gynecology

## 2018-07-25 ENCOUNTER — Ambulatory Visit (INDEPENDENT_AMBULATORY_CARE_PROVIDER_SITE_OTHER): Payer: BLUE CROSS/BLUE SHIELD | Admitting: Obstetrics & Gynecology

## 2018-07-25 VITALS — BP 140/90

## 2018-07-25 DIAGNOSIS — Z30431 Encounter for routine checking of intrauterine contraceptive device: Secondary | ICD-10-CM

## 2018-07-25 NOTE — Patient Instructions (Signed)
1. IUD check up New Mirena IUD inserted 4 weeks ago.  No complication.  Well-tolerated.  IUD in good position with strings visible and no sign of infection.  Patient reassured.  [place], it was a pleasure seeing you today!

## 2018-07-25 NOTE — Progress Notes (Signed)
    Allison Guzman 02-Jul-1971 [id number]        47 y.o.  G2P2L2  RP: Mirena IUD check  HPI: Doing well with new Mirena IUD.  No pelvic pain.  No vaginal discharge.  No abnormal bleeding.  Sexually active with no pain with intercourse.  No fever.   OB History  Gravida Para Term Preterm AB Living  2 2       2   SAB TAB Ectopic Multiple Live Births               # Outcome Date GA Lbr Len/2nd Weight Sex Delivery Anes PTL Lv  2 Para           1 Para             Past medical history,surgical history, problem list, medications, allergies, family history and social history were all reviewed and documented in the EPIC chart.   Directed ROS with pertinent positives and negatives documented in the history of present illness/assessment and plan.  Exam:  Vitals:   07/25/18 1504  BP: 140/90   General appearance:  Normal  Gynecologic exam: Vulva normal.  Speculum:  Cervix normal, no erythema.  IUD strings visible.  Normal vagina.  Normal secretions.   Assessment/Plan:  47 y.o. G2P2   1. IUD check up New Mirena IUD inserted 4 weeks ago.  No complication.  Well-tolerated.  IUD in good position with strings visible and no sign of infection.  Patient reassured.  Counseling on above issues and coordination of care more than 50% for 15 minutes.  Princess Bruins MD, 3:31 PM 07/25/2018

## 2018-07-26 NOTE — Telephone Encounter (Signed)
Received notification that PA required on Ampyra. I called [employer] to try and initiate. States PA for brand and generic on file for life of the policy. [id number].   Showing she filled generic 10/30 #60/30 day supply. Cannot fill until on or after 08/04/18.  [facility] running for generic, not brand name.  I called [facility] to make sure they received prescription from Ampyra support. They told me they forwarded prescription to them. [facility] verified they received rx for Brand name. They see rx in error was being run as generic for refill this month. They will re-process for brand name. Nothing further needed.

## 2018-08-06 ENCOUNTER — Other Ambulatory Visit: Payer: Self-pay | Admitting: Neurology

## 2018-08-15 ENCOUNTER — Telehealth: Payer: Self-pay | Admitting: *Deleted

## 2018-08-15 NOTE — Telephone Encounter (Signed)
Faxed completed/signed Lemtrada rems pt status form back at [phone]. Received fax confirmation.

## 2018-08-18 ENCOUNTER — Encounter: Payer: Self-pay | Admitting: Neurology

## 2018-08-25 ENCOUNTER — Telehealth: Payer: Self-pay | Admitting: Neurology

## 2018-08-25 MED ORDER — DALFAMPRIDINE ER 10 MG PO TB12: ORAL_TABLET | ORAL | 3 refills | Status: DC

## 2018-08-25 NOTE — Telephone Encounter (Addendum)
Returned call to the patient.  She would like a 90-day supply of generic Ampyra sent to [facility].  Rx has been sent.

## 2018-08-25 NOTE — Addendum Note (Signed)
Addended by: Noberto Retort C on: 08/25/2018 11:57 AM   Modules accepted: Orders

## 2018-08-25 NOTE — Telephone Encounter (Signed)
Pt is asking if there is a way for Dr Felecia Shelling to know get her back on the generic for dalfampridine (AMPYRA) 10 MG TB12 and if not if there is a financial assistance program she may be eligible for re: brand name version for the dalfampridine (AMPYRA) 10 MG TB12 please call

## 2018-09-01 ENCOUNTER — Other Ambulatory Visit: Payer: Self-pay | Admitting: *Deleted

## 2018-09-01 MED ORDER — DEXTROAMPHETAMINE SULFATE ER 15 MG PO CP24: ORAL_CAPSULE | ORAL | 0 refills | Status: DC

## 2018-09-20 ENCOUNTER — Encounter: Payer: Self-pay | Admitting: Neurology

## 2018-09-20 ENCOUNTER — Other Ambulatory Visit: Payer: Self-pay | Admitting: Neurology

## 2018-09-29 ENCOUNTER — Ambulatory Visit: Payer: BLUE CROSS/BLUE SHIELD | Admitting: Neurology

## 2018-10-11 MED ORDER — SOLIFENACIN SUCCINATE 10 MG PO TABS: 10.0000 mg | ORAL_TABLET | Freq: Every day | ORAL | 5 refills | Status: DC

## 2018-10-11 NOTE — Addendum Note (Signed)
Addended by: Hope Pigeon on: 10/11/2018 05:06 PM   Modules accepted: Orders

## 2018-10-20 ENCOUNTER — Other Ambulatory Visit: Payer: Self-pay | Admitting: Neurology

## 2018-10-30 ENCOUNTER — Encounter: Payer: Self-pay | Admitting: *Deleted

## 2018-11-06 ENCOUNTER — Other Ambulatory Visit: Payer: Self-pay | Admitting: *Deleted

## 2018-11-06 MED ORDER — DEXTROAMPHETAMINE SULFATE ER 15 MG PO CP24: ORAL_CAPSULE | ORAL | 0 refills | Status: DC

## 2018-11-17 ENCOUNTER — Encounter: Payer: Self-pay | Admitting: Neurology

## 2018-11-29 ENCOUNTER — Ambulatory Visit (INDEPENDENT_AMBULATORY_CARE_PROVIDER_SITE_OTHER): Payer: BLUE CROSS/BLUE SHIELD | Admitting: Neurology

## 2018-11-29 ENCOUNTER — Other Ambulatory Visit: Payer: Self-pay

## 2018-11-29 ENCOUNTER — Encounter: Payer: Self-pay | Admitting: Neurology

## 2018-11-29 VITALS — BP 106/82 | HR 91 | Ht 62.0 in | Wt 253.0 lb

## 2018-11-29 DIAGNOSIS — R269 Unspecified abnormalities of gait and mobility: Secondary | ICD-10-CM

## 2018-11-29 DIAGNOSIS — R350 Frequency of micturition: Secondary | ICD-10-CM

## 2018-11-29 DIAGNOSIS — R208 Other disturbances of skin sensation: Secondary | ICD-10-CM

## 2018-11-29 DIAGNOSIS — Z79899 Other long term (current) drug therapy: Secondary | ICD-10-CM | POA: Diagnosis not present

## 2018-11-29 DIAGNOSIS — G35 Multiple sclerosis: Secondary | ICD-10-CM

## 2018-11-29 DIAGNOSIS — R5382 Chronic fatigue, unspecified: Secondary | ICD-10-CM | POA: Diagnosis not present
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
## 2018-12-23 ENCOUNTER — Other Ambulatory Visit: Payer: Self-pay | Admitting: Neurology

## 2019-01-04 ENCOUNTER — Other Ambulatory Visit: Payer: Self-pay

## 2019-01-04 MED ORDER — DEXTROAMPHETAMINE SULFATE ER 15 MG PO CP24: ORAL_CAPSULE | ORAL | 0 refills | Status: DC

## 2019-01-04 NOTE — Telephone Encounter (Signed)
Pt is up to date on her appts. Pt is due for a refill on her dexedrine. Pt is requesting a refill through mychart. [place] Controlled Substance Registry checked.

## 2019-01-31 ENCOUNTER — Telehealth: Payer: Self-pay | Admitting: *Deleted

## 2019-01-31 NOTE — Telephone Encounter (Signed)
Called, LVM for pt to all office. She is overdue for lemtrada labs. Last collected that we have on file is 11/17/2018. She needs to call [facility] at [phone] and schedule a home lab draw asap to have these labs done. This is with the [facility].

## 2019-02-15 ENCOUNTER — Encounter: Payer: Self-pay | Admitting: Neurology

## 2019-03-07 ENCOUNTER — Other Ambulatory Visit: Payer: Self-pay

## 2019-03-08 ENCOUNTER — Encounter: Payer: Self-pay | Admitting: Obstetrics & Gynecology

## 2019-03-08 ENCOUNTER — Ambulatory Visit (INDEPENDENT_AMBULATORY_CARE_PROVIDER_SITE_OTHER): Payer: BC Managed Care – PPO | Admitting: Obstetrics & Gynecology

## 2019-03-08 VITALS — BP 142/88 | Ht 61.5 in | Wt 252.0 lb

## 2019-03-08 DIAGNOSIS — Z01419 Encounter for gynecological examination (general) (routine) without abnormal findings: Secondary | ICD-10-CM | POA: Diagnosis not present

## 2019-03-08 DIAGNOSIS — R87610 Atypical squamous cells of undetermined significance on cytologic smear of cervix (ASC-US): Secondary | ICD-10-CM | POA: Diagnosis not present

## 2019-03-08 DIAGNOSIS — Z6841 Body Mass Index (BMI) 40.0 and over, adult: Secondary | ICD-10-CM

## 2019-03-08 DIAGNOSIS — Z30431 Encounter for routine checking of intrauterine contraceptive device: Secondary | ICD-10-CM | POA: Diagnosis not present

## 2019-03-08 DIAGNOSIS — Z1151 Encounter for screening for human papillomavirus (HPV): Secondary | ICD-10-CM

## 2019-03-08 NOTE — Addendum Note (Signed)
Addended by: Thurnell Garbe A on: 03/08/2019 03:25 PM   Modules accepted: Orders

## 2019-03-08 NOTE — Progress Notes (Signed)
Allison Guzman 1971-08-25 [id number]   History:    48 y.o. G2P2L2 Married  RP:  Established patient presenting for annual gyn exam   HPI: Well on Mirena IUD x 06/2018.  No BTB.  No pelvic pain.  No pain with IC.  Normal vaginal secretions.  Urine/BMs normal.  Breasts normal.  BMI 46.84.  Needs to decrease calories.  Improving her fitness with a new [employer] at home.  Health labs with Fam MD.  Past medical history,surgical history, family history and social history were all reviewed and documented in the EPIC chart.  Gynecologic History No LMP recorded. (Menstrual status: IUD). Contraception: Mirena IUD x 06/2018 Last Pap: 02/2018. Results were: ASCUS/HPV HR neg Last mammogram: 03/2018. Results were: Bilateral Dx mammo benign Bone Density: Never Colonoscopy: Never  Obstetric History OB History  Gravida Para Term Preterm AB Living  2 2       2   SAB TAB Ectopic Multiple Live Births               # Outcome Date GA Lbr Len/2nd Weight Sex Delivery Anes PTL Lv  2 Para           1 Para              ROS: A ROS was performed and pertinent positives and negatives are included in the history.  GENERAL: No fevers or chills. HEENT: No change in vision, no earache, sore throat or sinus congestion. NECK: No pain or stiffness. CARDIOVASCULAR: No chest pain or pressure. No palpitations. PULMONARY: No shortness of breath, cough or wheeze. GASTROINTESTINAL: No abdominal pain, nausea, vomiting or diarrhea, melena or bright red blood per rectum. GENITOURINARY: No urinary frequency, urgency, hesitancy or dysuria. MUSCULOSKELETAL: No joint or muscle pain, no back pain, no recent trauma. DERMATOLOGIC: No rash, no itching, no lesions. ENDOCRINE: No polyuria, polydipsia, no heat or cold intolerance. No recent change in weight. HEMATOLOGICAL: No anemia or easy bruising or bleeding. NEUROLOGIC: No headache, seizures, numbness, tingling or weakness. PSYCHIATRIC: No depression, no loss of interest in normal  activity or change in sleep pattern.     Exam:   BP (!) 142/88   Ht 5' 1.5" (1.562 m)   Wt 252 lb (114.3 kg)   BMI 46.84 kg/m   Body mass index is 46.84 kg/m.  General appearance : Well developed well nourished female. No acute distress HEENT: Eyes: no retinal hemorrhage or exudates,  Neck supple, trachea midline, no carotid bruits, no thyroidmegaly Lungs: Clear to auscultation, no rhonchi or wheezes, or rib retractions  Heart: Regular rate and rhythm, no murmurs or gallops Breast:Examined in sitting and supine position were symmetrical in appearance, no palpable masses or tenderness,  no skin retraction, no nipple inversion, no nipple discharge, no skin discoloration, no axillary or supraclavicular lymphadenopathy Abdomen: no palpable masses or tenderness, no rebound or guarding Extremities: no edema or skin discoloration or tenderness  Pelvic: Vulva: Normal             Vagina: No gross lesions or discharge  Cervix: No gross lesions or discharge.  IUD string visible.  Pap/HPV HR done.  Uterus  AV, mildly increased in size, stable probable Fibroids, non-tender and mobile  Adnexa  Without masses or tenderness  Anus: Normal   Assessment/Plan:  48 y.o. female for annual exam   1. Encounter for routine gynecological examination with Papanicolaou smear of cervix Stable gynecologic exam, possible small uterine Fibroids.  H/O ASCUS/HPV HR negative 02/2018, Pap/HPV HR  done today.  Breasts normal.  Bilateral Dx mammo benign 03/2018.  Health labs with Family MD.  2. ASCUS of cervix with negative high risk HPV  3. Encounter for routine checking of intrauterine contraceptive device (IUD) Mirena IUD x 06/2018 in good position and well tolerated.    4. Class 3 severe obesity due to excess calories without serious comorbidity with body mass index (BMI) of 45.0 to 49.9 in adult [facility]) Recommend decreased Calorie/Carb diet such as [employer].  Continue with Fitness, aerobic activities >5  times per week and weightlifting every 2 days.  Princess Bruins MD, 8:40 AM 03/08/2019

## 2019-03-08 NOTE — Patient Instructions (Signed)
1. Encounter for routine gynecological examination with Papanicolaou smear of cervix Stable gynecologic exam, possible small uterine Fibroids.  H/O ASCUS/HPV HR negative 02/2018, Pap/HPV HR done today.  Breasts normal.  Bilateral Dx mammo benign 03/2018.  Health labs with Family MD.  2. ASCUS of cervix with negative high risk HPV  3. Encounter for routine checking of intrauterine contraceptive device (IUD) Mirena IUD x 06/2018 in good position and well tolerated.    4. Class 3 severe obesity due to excess calories without serious comorbidity with body mass index (BMI) of 45.0 to 49.9 in adult [facility]) Recommend decreased Calorie/Carb diet such as [employer].  Continue with Fitness, aerobic activities >5 times per week and weightlifting every 2 days.  Kalleigh, it was a pleasure seeing you today!  I will inform you of your results as soon as they are available.

## 2019-03-09 LAB — PAP, TP IMAGING W/ HPV RNA, RFLX HPV TYPE 16,18/45: HPV DNA High Risk: NOT DETECTED

## 2019-03-21 ENCOUNTER — Encounter: Payer: Self-pay | Admitting: Neurology

## 2019-04-23 ENCOUNTER — Other Ambulatory Visit: Payer: Self-pay | Admitting: *Deleted

## 2019-04-23 MED ORDER — DEXTROAMPHETAMINE SULFATE ER 15 MG PO CP24: ORAL_CAPSULE | ORAL | 0 refills | Status: DC

## 2019-05-10 ENCOUNTER — Telehealth: Payer: Self-pay | Admitting: *Deleted

## 2019-05-10 NOTE — Telephone Encounter (Signed)
Called and spoke with pt. She is past due for monthly Lemtrada labs. Last collection 03/21/19. She states she spoke with Melissa who was with old home draw program and she is scheduled to come out on 05/16/19 to her work to draw labs with new home draw program. She will call if she has any problems.

## 2019-05-23 ENCOUNTER — Encounter: Payer: Self-pay | Admitting: Neurology

## 2019-06-06 ENCOUNTER — Encounter: Payer: Self-pay | Admitting: Neurology

## 2019-06-06 ENCOUNTER — Ambulatory Visit (INDEPENDENT_AMBULATORY_CARE_PROVIDER_SITE_OTHER): Payer: BC Managed Care – PPO | Admitting: Neurology

## 2019-06-06 ENCOUNTER — Other Ambulatory Visit: Payer: Self-pay

## 2019-06-06 VITALS — BP 100/70 | HR 91 | Temp 98.2°F | Ht 61.5 in | Wt 250.5 lb

## 2019-06-06 DIAGNOSIS — M1710 Unilateral primary osteoarthritis, unspecified knee: Secondary | ICD-10-CM

## 2019-06-06 DIAGNOSIS — G35 Multiple sclerosis: Secondary | ICD-10-CM

## 2019-06-06 DIAGNOSIS — R350 Frequency of micturition: Secondary | ICD-10-CM

## 2019-06-06 DIAGNOSIS — G43009 Migraine without aura, not intractable, without status migrainosus: Secondary | ICD-10-CM

## 2019-06-06 DIAGNOSIS — M179 Osteoarthritis of knee, unspecified: Secondary | ICD-10-CM

## 2019-06-06 DIAGNOSIS — Z79899 Other long term (current) drug therapy: Secondary | ICD-10-CM

## 2019-06-06 DIAGNOSIS — R269 Unspecified abnormalities of gait and mobility: Secondary | ICD-10-CM

## 2019-06-06 DIAGNOSIS — G35D Multiple sclerosis, unspecified: Secondary | ICD-10-CM
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
## 2019-06-12 ENCOUNTER — Telehealth: Payer: Self-pay | Admitting: Neurology

## 2019-06-12 NOTE — Telephone Encounter (Signed)
no to the covid questions MR Brain w/wo contrast Dr. Cheree Ditto Auth: [id number] (exp. 12/07/19) & Novella [id number] (exp. 12/08/19). Patient is scheduled at [facility] for 06/13/19

## 2019-06-13 ENCOUNTER — Other Ambulatory Visit: Payer: Self-pay

## 2019-06-13 ENCOUNTER — Ambulatory Visit (INDEPENDENT_AMBULATORY_CARE_PROVIDER_SITE_OTHER): Payer: BC Managed Care – PPO

## 2019-06-13 DIAGNOSIS — G35 Multiple sclerosis: Secondary | ICD-10-CM

## 2019-06-13 MED ORDER — GADOBENATE DIMEGLUMINE 529 MG/ML IV SOLN
20.0000 mL | Freq: Once | INTRAVENOUS | Status: AC | PRN
  Administered 2019-06-13: 11:00:00 20 mL via INTRAVENOUS

## 2019-06-14 ENCOUNTER — Telehealth: Payer: Self-pay | Admitting: *Deleted

## 2019-06-14 NOTE — Telephone Encounter (Signed)
-----   Message from Britt Bottom, MD sent at 06/13/2019  5:48 PM EDT ----- Please let her know that the MRI of the brain did not show any new changes.

## 2019-06-18 ENCOUNTER — Other Ambulatory Visit: Payer: Self-pay | Admitting: *Deleted

## 2019-06-18 MED ORDER — MELOXICAM 15 MG PO TABS: 15.0000 mg | ORAL_TABLET | Freq: Every day | ORAL | 5 refills | Status: DC

## 2019-07-07 ENCOUNTER — Other Ambulatory Visit: Payer: Self-pay | Admitting: Neurology

## 2019-07-31 DIAGNOSIS — F325 Major depressive disorder, single episode, in full remission: Secondary | ICD-10-CM | POA: Insufficient documentation

## 2019-07-31 DIAGNOSIS — M109 Gout, unspecified: Secondary | ICD-10-CM | POA: Insufficient documentation

## 2019-08-01 ENCOUNTER — Encounter: Payer: Self-pay | Admitting: Neurology

## 2019-08-27 ENCOUNTER — Encounter: Payer: Self-pay | Admitting: *Deleted

## 2019-09-26 ENCOUNTER — Telehealth: Payer: Self-pay | Admitting: Neurology

## 2019-09-26 MED ORDER — DALFAMPRIDINE ER 10 MG PO TB12: ORAL_TABLET | ORAL | 3 refills | Status: DC

## 2019-09-26 NOTE — Telephone Encounter (Signed)
Pt is requesting a refill of dalfampridine (AMPYRA) 10 MG TB12, to be sent to [place], [place]

## 2019-09-26 NOTE — Telephone Encounter (Signed)
Called and spoke with pt. Scheduled 6 month f/u for 12/06/19 at 11am with Dr. Felecia Shelling. She did not have one scheduled. Advised I will send in refills. She verbalized understanding.

## 2019-09-27 DIAGNOSIS — E66813 Obesity, class 3: Secondary | ICD-10-CM | POA: Insufficient documentation

## 2019-10-08 NOTE — Telephone Encounter (Signed)
Noted, we received PA request today by fax as well. We will work on PA this week for pt.

## 2019-10-08 NOTE — Telephone Encounter (Signed)
Allison Guzman with [facility] called to inform they are needing a PA for the dalfampridine (AMPYRA) 10 MG TB12,  Please follow up

## 2019-10-09 NOTE — Telephone Encounter (Signed)
Submitted PA dalfampridine on CMM. Key: [id number]. Waiting on determination from [place] [facility] [place].

## 2019-10-09 NOTE — Telephone Encounter (Signed)
PA approved Effective from 10/08/2019 through 09/12/2038.

## 2019-11-11 ENCOUNTER — Other Ambulatory Visit: Payer: Self-pay | Admitting: Neurology

## 2019-12-06 ENCOUNTER — Ambulatory Visit: Payer: Managed Care, Other (non HMO) | Admitting: Neurology

## 2019-12-06 ENCOUNTER — Other Ambulatory Visit: Payer: Self-pay

## 2019-12-06 ENCOUNTER — Encounter: Payer: Self-pay | Admitting: Neurology

## 2019-12-06 VITALS — BP 118/80 | HR 84 | Temp 97.8°F | Ht 61.5 in | Wt 240.5 lb

## 2019-12-06 DIAGNOSIS — Z79899 Other long term (current) drug therapy: Secondary | ICD-10-CM | POA: Diagnosis not present

## 2019-12-06 DIAGNOSIS — G43009 Migraine without aura, not intractable, without status migrainosus: Secondary | ICD-10-CM | POA: Diagnosis not present

## 2019-12-06 DIAGNOSIS — F988 Other specified behavioral and emotional disorders with onset usually occurring in childhood and adolescence: Secondary | ICD-10-CM | POA: Insufficient documentation

## 2019-12-06 DIAGNOSIS — R269 Unspecified abnormalities of gait and mobility: Secondary | ICD-10-CM | POA: Diagnosis not present

## 2019-12-06 DIAGNOSIS — G35 Multiple sclerosis: Secondary | ICD-10-CM

## 2019-12-06 DIAGNOSIS — R3911 Hesitancy of micturition: Secondary | ICD-10-CM

## 2019-12-06 DIAGNOSIS — G35D Multiple sclerosis, unspecified: Secondary | ICD-10-CM

## 2019-12-06 DIAGNOSIS — R208 Other disturbances of skin sensation: Secondary | ICD-10-CM

## 2019-12-06 MED ORDER — SERTRALINE HCL 100 MG PO TABS: 100.0000 mg | ORAL_TABLET | Freq: Every day | ORAL | 3 refills | Status: DC

## 2019-12-06 MED ORDER — DEXTROAMPHETAMINE SULFATE ER 10 MG PO CP24: ORAL_CAPSULE | ORAL | 0 refills | Status: DC
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
## 2019-12-09 ENCOUNTER — Other Ambulatory Visit: Payer: Self-pay | Admitting: Neurology

## 2020-01-06 ENCOUNTER — Other Ambulatory Visit: Payer: Self-pay | Admitting: Neurology

## 2020-03-13 ENCOUNTER — Ambulatory Visit (INDEPENDENT_AMBULATORY_CARE_PROVIDER_SITE_OTHER): Payer: BC Managed Care – PPO | Admitting: Obstetrics & Gynecology

## 2020-03-13 ENCOUNTER — Encounter: Payer: Self-pay | Admitting: Obstetrics & Gynecology

## 2020-03-13 ENCOUNTER — Other Ambulatory Visit: Payer: Self-pay

## 2020-03-13 VITALS — BP 124/80 | Ht 61.0 in | Wt 242.0 lb

## 2020-03-13 DIAGNOSIS — Z30431 Encounter for routine checking of intrauterine contraceptive device: Secondary | ICD-10-CM | POA: Diagnosis not present

## 2020-03-13 DIAGNOSIS — Z01419 Encounter for gynecological examination (general) (routine) without abnormal findings: Secondary | ICD-10-CM | POA: Diagnosis not present

## 2020-03-13 DIAGNOSIS — Z6841 Body Mass Index (BMI) 40.0 and over, adult: Secondary | ICD-10-CM

## 2020-03-13 NOTE — Progress Notes (Signed)
Allison Guzman March 30, 1971 [id number]   History:    49 y.o. G2P2L2 Married  RP:  Established patient presenting for annual gyn exam   HPI: Well on Mirena IUD x 06/2018.  No BTB.  No pelvic pain.  No pain with IC.  Normal vaginal secretions.  Urine/BMs normal.  Breasts normal.  BMI 45.73.  Needs to decrease calories.  Improving her fitness with a Total Gym at home.  Health labs with Fam MD.   Past medical history,surgical history, family history and social history were all reviewed and documented in the EPIC chart.  Gynecologic History No LMP recorded. (Menstrual status: IUD).  Obstetric History OB History  Gravida Para Term Preterm AB Living  2 2       2   SAB TAB Ectopic Multiple Live Births               # Outcome Date GA Lbr Len/2nd Weight Sex Delivery Anes PTL Lv  2 Para           1 Para              ROS: A ROS was performed and pertinent positives and negatives are included in the history.  GENERAL: No fevers or chills. HEENT: No change in vision, no earache, sore throat or sinus congestion. NECK: No pain or stiffness. CARDIOVASCULAR: No chest pain or pressure. No palpitations. PULMONARY: No shortness of breath, cough or wheeze. GASTROINTESTINAL: No abdominal pain, nausea, vomiting or diarrhea, melena or bright red blood per rectum. GENITOURINARY: No urinary frequency, urgency, hesitancy or dysuria. MUSCULOSKELETAL: No joint or muscle pain, no back pain, no recent trauma. DERMATOLOGIC: No rash, no itching, no lesions. ENDOCRINE: No polyuria, polydipsia, no heat or cold intolerance. No recent change in weight. HEMATOLOGICAL: No anemia or easy bruising or bleeding. NEUROLOGIC: No headache, seizures, numbness, tingling or weakness. PSYCHIATRIC: No depression, no loss of interest in normal activity or change in sleep pattern.     Exam:   BP 124/80 (BP Location: Right Arm, Patient Position: Sitting, Cuff Size: Large)   Ht 5\' 1"  (1.549 m)   Wt 242 lb (109.8 kg)   BMI 45.73  kg/m   Body mass index is 45.73 kg/m.  General appearance : Well developed well nourished female. No acute distress HEENT: Eyes: no retinal hemorrhage or exudates,  Neck supple, trachea midline, no carotid bruits, no thyroidmegaly Lungs: Clear to auscultation, no rhonchi or wheezes, or rib retractions  Heart: Regular rate and rhythm, no murmurs or gallops Breast:Examined in sitting and supine position were symmetrical in appearance, no palpable masses or tenderness,  no skin retraction, no nipple inversion, no nipple discharge, no skin discoloration, no axillary or supraclavicular lymphadenopathy Abdomen: no palpable masses or tenderness, no rebound or guarding Extremities: no edema or skin discoloration or tenderness  Pelvic: Vulva: Normal             Vagina: No gross lesions or discharge  Cervix: No gross lesions or discharge.  IUD strings visible at the external os.  Uterus  AV, normal size, shape and consistency, non-tender and mobile  Adnexa  Without masses or tenderness  Anus: Normal   Assessment/Plan:  49 y.o. female for annual exam   1. Well female exam with routine gynecological exam Normal gynecologic exam.  No indication to repeat a Pap test this year.  Breast exam normal.  Last screening mammogram 2019, will schedule a screening mammogram now.  Health labs with family physician.  2. Encounter for  routine checking of intrauterine contraceptive device (IUD) Mirena IUD since October 2019 in good position and well-tolerated.  3. Class 3 severe obesity due to excess calories without serious comorbidity with body mass index (BMI) of 45.0 to 49.9 in adult [facility]) Recommend a lower calorie/carb diet such as [employer].  Aerobic activities 5 times a week and light weightlifting every 2 days.  Princess Bruins MD, 8:42 AM 03/13/2020

## 2020-03-15 ENCOUNTER — Encounter: Payer: Self-pay | Admitting: Obstetrics & Gynecology

## 2020-03-15 NOTE — Patient Instructions (Signed)
1. Well female exam with routine gynecological exam Normal gynecologic exam.  No indication to repeat a Pap test this year.  Breast exam normal.  Last screening mammogram 2019, will schedule a screening mammogram now.  Health labs with family physician.  2. Encounter for routine checking of intrauterine contraceptive device (IUD) Mirena IUD since October 2019 in good position and well-tolerated.  3. Class 3 severe obesity due to excess calories without serious comorbidity with body mass index (BMI) of 45.0 to 49.9 in adult [facility] Recommend a lower calorie/carb diet such as [employer].  Aerobic activities 5 times a week and light weightlifting every 2 days.  Allison Guzman, it was a pleasure seeing you today!

## 2020-06-12 ENCOUNTER — Ambulatory Visit: Payer: Managed Care, Other (non HMO) | Admitting: Neurology

## 2020-06-22 ENCOUNTER — Other Ambulatory Visit: Payer: Self-pay

## 2020-06-23 MED ORDER — DEXTROAMPHETAMINE SULFATE ER 10 MG PO CP24: ORAL_CAPSULE | ORAL | 0 refills | Status: DC

## 2020-06-23 NOTE — Telephone Encounter (Signed)
Called pt. She was last seen 12/06/19. Has no pending f/u. Scheduled next follow up with her for 07/11/20 at 11:30am.  Advised I will send refill request to Dr. Felecia Shelling to send in for her. She verbalized understanding. Checked drug registry. She last refilled 12/06/19 #90.

## 2020-07-11 ENCOUNTER — Ambulatory Visit (INDEPENDENT_AMBULATORY_CARE_PROVIDER_SITE_OTHER): Payer: BC Managed Care – PPO | Admitting: Neurology

## 2020-07-11 ENCOUNTER — Encounter: Payer: Self-pay | Admitting: Neurology

## 2020-07-11 VITALS — BP 102/56 | HR 97 | Ht 61.0 in | Wt 244.5 lb

## 2020-07-11 DIAGNOSIS — R208 Other disturbances of skin sensation: Secondary | ICD-10-CM

## 2020-07-11 DIAGNOSIS — G35 Multiple sclerosis: Secondary | ICD-10-CM

## 2020-07-11 DIAGNOSIS — R269 Unspecified abnormalities of gait and mobility: Secondary | ICD-10-CM | POA: Diagnosis not present

## 2020-07-11 DIAGNOSIS — F988 Other specified behavioral and emotional disorders with onset usually occurring in childhood and adolescence: Secondary | ICD-10-CM

## 2020-07-11 DIAGNOSIS — Z79899 Other long term (current) drug therapy: Secondary | ICD-10-CM | POA: Diagnosis not present

## 2020-07-11 DIAGNOSIS — R5382 Chronic fatigue, unspecified: Secondary | ICD-10-CM

## 2020-07-11 MED ORDER — SERTRALINE HCL 100 MG PO TABS: 100.0000 mg | ORAL_TABLET | Freq: Every day | ORAL | 3 refills | Status: DC

## 2020-07-11 MED ORDER — MELOXICAM 15 MG PO TABS: ORAL_TABLET | ORAL | 5 refills | Status: DC

## 2020-07-11 NOTE — Progress Notes (Signed)
[facility]  PATIENT: Allison Guzman DOB: 1971/05/06  REFERRING CLINICIAN:  Heather Spry HISTORY FROM:  patient REASON FOR VISIT:  Multiple sclerosis.   HISTORICAL  CHIEF COMPLAINT:  Chief Complaint  Patient presents with  . Follow-up    Rm 12, MS Lemtrada 08/19/14-08/23/14 23yr ,  08/26/15-08/28/15/ 41yr.     HISTORY OF PRESENT ILLNESS:  Allison Guzman is a 49 year old woman with relapsing remitting MS.  Update 12/06/2019: Her second course of [name] was November 2016 and she has completed the 4 years of REMS monitoring. She has not had any exacerbations. MRI of the brain 06/13/2019 was personally reviewed. It showed multiple foci within the hemispheres, brainstem and cerebellum in a pattern and configuration consistent with chronic demyelinating plaque associated with multiple sclerosis.  Compared to the MRI dated 06/07/2018, there are no new lesions.  None of the foci enhances.  Gait is mildly worse, especially balance.   Dalfampridine has helped some She tries to avoid stairs and needs to hold the bannister.  Her knee is better so the gait issues seems to be more MS related and she has had a few falls when she feels she is being pushed to the left.    Strength and sensation are doing well.    She has urinary frequency, helped by solifenacin.  She reports reduced color vision on the left.  Visual acuity is normal.   She notes some diplopia if she looks far left while driving looking over shoulder.     She has fatigue and sleepiness are helped by dexedrine three pills aday.   She sleeps well most nights.  She notes some depression and more anxiety (after job change).   Sertraline is mildly better than Celexa.   Abilify caused weight gain.   Her current job is less cognitively challenging so she is doing better.     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     MS History:   She was diagnosed with relapsing remitting MS in 2004 after  presenting with diplopia and vertigo. She had MRI/CSF c/w MS.   She had a five-day course of IV steroids.    Dr. Brett Fairy placed her on Rebif.  She had a couple of exacerbations while on Rebif with some effect on her gait.   She trandferred care to me.  She was switched to Copaxone plus estriol. Additionally, CellCept was tried for short while.  Unfortunately, she continued to have breakthrough exacerbations. Around 2008, she started Tysabri. She did fairly well on Tysabri with one definite exacerbation in 2012.    She was also JCV antibody positive. Therefore, she opted to switch to [place].   However, she continued to have exacerbations and disease activity on MRI and she switched to Tecfidera.   For about one year she was on Tecfidera but had a lot of GI side effects with nausea. Additionally she had breakthrough disease. December 2015 she received Lemtrada at 12 mg per day for 5 days. She tolerated the infusion fairly well with no significant systemic side effects or allergic reactions.   We had previously discussed the risk of autoimmune disease with Lemtrada. She is post operative with thyroidectomy due to thyroid cancer.   A little tissue remains but is being suppressed.   She is on supplementation.   MRI 06/05/15 showed 3 small enhancing lesions and she received several days IV steroid at that time.   She hd her second course of [name]  in December 2016 x 3 days.   IMAGING MRI brain 06/13/2019 showed  multiple foci within the hemispheres, brainstem and cerebellum in a pattern and configuration consistent with chronic demyelinating plaque associated with multiple sclerosis.  Compared to the MRI dated 06/07/2018, there are no new lesions.  None of the foci enhances.    MRI brain 06/07/2018 showed Multiple T2/FLAIR hyperintense foci in the hemispheres, brainstem and spinal cord in a pattern and configuration consistent with chronic demyelinating plaque associated with multiple sclerosis.  None of the foci  appears to be acute.  When compared to the MRI dated 06/29/2017, there is no interval change  MRI cervical spine 03/21/2004 shows multifocal cervical cord involvement by MS plaques, most prominent at the C6-7 level particularly towards the left. Foci are at C2-3, C4-5, C6-7 and T1-2 levels.  Interestingly at this same level the patient has a shallow broad based disc protrusion that effaces the ventral subarachnoid space.    REVIEW OF SYSTEMS:  Constitutional: No fevers, chills, sweats, or change in appetite.  She has fatigue and hypersomnia Eyes:  She notes double vision.   No eye pain Ear, nose and throat: No hearing loss, ear pain, nasal congestion, sore throat Cardiovascular: No chest pain, palpitations Respiratory:  No shortness of breath at rest or with exertion.   No wheezes GastrointestinaI: No nausea, vomiting, diarrhea, abdominal pain, fecal incontinence Genitourinary:  see above. Musculoskeletal:  No neck pain, back pain Integumentary: No rash, pruritus, skin lesions Neurological: as above Psychiatric: No depression at this time (some in past)  No anxiety Endocrine: No palpitations, diaphoresis, change in appetite, change in weigh or increased thirst Hematologic/Lymphatic:  No anemia, purpura, petechiae. Allergic/Immunologic: No itchy/runny eyes, nasal congestion, recent allergic reactions, rashes  ALLERGIES: Allergies  Allergen Reactions  . Penicillins     unknown    HOME MEDICATIONS: Outpatient Medications Prior to Visit  Medication Sig Dispense Refill  . dalfampridine (AMPYRA) 10 MG TB12 TAKE 1 TABLET BY MOUTH EVERY 12 HOURS 180 tablet 3  . dextroamphetamine (DEXEDRINE) 10 MG 24 hr capsule Take 2 to 3 po daily 90 capsule 0  . omeprazole (PRILOSEC) 40 MG capsule Take 40 mg by mouth daily.   11  . solifenacin (VESICARE) 10 MG tablet TAKE 1 TABLET(10 MG) BY MOUTH DAILY 30 tablet 5  . TIROSINT 88 MCG CAPS Take 176 mcg by mouth daily.  1  . meloxicam (MOBIC) 15 MG tablet  TAKE 1 TABLET(15 MG) BY MOUTH DAILY 30 tablet 5  . sertraline (ZOLOFT) 100 MG tablet Take 1 tablet (100 mg total) by mouth daily. 90 tablet 3   Facility-Administered Medications Prior to Visit  Medication Dose Route Frequency Provider Last Rate Last Admin  . gadopentetate dimeglumine (MAGNEVIST) injection 20 mL  20 mL Intravenous Once PRN Kunio Cummiskey, Nanine Means, MD        PAST MEDICAL HISTORY: Past Medical History:  Diagnosis Date  . Anemia   . Arthritis   . Cancer ([place])   . Common migraine 06/16/2017  . Movement disorder   . Multiple sclerosis ([place])   . Thyroid cancer ([place])    2010  . Thyroid disease   . Vision abnormalities     PAST SURGICAL HISTORY: Past Surgical History:  Procedure Laterality Date  . CESAREAN SECTION    . IUD REMOVAL N/A 12/27/2014   Procedure: INTRAUTERINE DEVICE (IUD) REMOVAL;  Surgeon: Princess Bruins, MD;  Location: [facility];  Service: Gynecology;  Laterality: N/A;  . JOINT REPLACEMENT    .  KNEE SURGERY    . LAPAROSCOPY N/A 12/27/2014   Procedure: LAPAROSCOPY DIAGNOSTIC ;  Surgeon: Princess Bruins, MD;  Location: [facility];  Service: Gynecology;  Laterality: N/A;  . PATELLA RECONSTRUCTION    . THYROIDECTOMY      FAMILY HISTORY: Family History  Problem Relation Age of Onset  . Breast cancer Maternal Grandmother 55  . Cancer Maternal Grandfather        bone   . Heart disease Paternal Grandmother   . Heart disease Paternal Grandfather   . Healthy Mother   . Cancer Father        throat   . Healthy Brother   . Healthy Sister   . Healthy Sister     SOCIAL HISTORY:  Social History   Socioeconomic History  . Marital status: Married    Spouse name: Not on file  . Number of children: Not on file  . Years of education: Not on file  . Highest education level: Not on file  Occupational History  . Not on file  Tobacco Use  . Smoking status: Former Smoker    Quit date: 10/02/2003    Years since quitting: 16.7  . Smokeless tobacco: Never Used   Vaping Use  . Vaping Use: Never used  Substance and Sexual Activity  . Alcohol use: No    Alcohol/week: 0.0 standard drinks  . Drug use: No  . Sexual activity: Yes    Partners: Male    Birth control/protection: Surgical, None    Comment: 1st intercourse- 16, partners- 8, married- 24 yrs   Other Topics Concern  . Not on file  Social History Narrative  . Not on file   Social Determinants of Health   Financial Resource Strain:   . Difficulty of Paying Living Expenses: Not on file  Food Insecurity:   . Worried About Charity fundraiser in the Last Year: Not on file  . Ran Out of Food in the Last Year: Not on file  Transportation Needs:   . Lack of Transportation (Medical): Not on file  . Lack of Transportation (Non-Medical): Not on file  Physical Activity:   . Days of Exercise per Week: Not on file  . Minutes of Exercise per Session: Not on file  Stress:   . Feeling of Stress : Not on file  Social Connections:   . Frequency of Communication with Friends and Family: Not on file  . Frequency of Social Gatherings with Friends and Family: Not on file  . Attends Religious Services: Not on file  . Active Member of Clubs or Organizations: Not on file  . Attends Archivist Meetings: Not on file  . Marital Status: Not on file  Intimate Partner Violence:   . Fear of Current or Ex-Partner: Not on file  . Emotionally Abused: Not on file  . Physically Abused: Not on file  . Sexually Abused: Not on file     PHYSICAL EXAM  Vitals:   07/11/20 1123  BP: (!) 102/56  Pulse: 97  SpO2: 98%  Weight: 244 lb 8 oz (110.9 kg)  Height: 5\' 1"  (1.549 m)    Body mass index is 46.2 kg/m.   General: The patient is an overweight woman in no acute distress  Neurologic Exam  Mental status: The patient is alert and oriented x 3 at the time of the examination. The patient has apparent normal recent and remote memory, with an apparently normal attention span and concentration  ability.   Speech  is normal.  Cranial nerves: There is mild disconjugate gaze looking for left.. She has reduced color vision on the left.  Facial strength is normal.  Hearing appears to be normal.  Motor:  Muscle bulk is normal.  She has increased muscle tone in the legs.  Strength is 5/5 in the arms and 4+/5 in the legs, left worse than right  Sensory: She has reduced vibration sensation in the right knee relative to the left.  Touch sensation is mildly reduced in left leg relative to right.  Coordination: Cerebellar testing shows slightly reduced finger-nose-finger on the left.  She has mildly reduced heel -to-shin left worse than right.  Gait and station: Station is slightly wide.  Gait is mildly wide.  Tandem walk is poor.  Her Romberg is mildly positive  Reflexes: Deep tendon reflexes are symmetric and brisk bilaterally.    There is no ankle clonus.     ASSESSMENT AND PLAN  Abnormality of gait  Multiple sclerosis ([place]) - Plan: MR CERVICAL SPINE W WO CONTRAST, MR BRAIN W WO CONTRAST  Dysesthesia  High risk medication use  Attention deficit disorder, unspecified hyperactivity presence  Chronic fatigue    1.   We discussed that many people can get a prolonged benefit from [name].  We do need to check periodic MRIs to determine if she is having breakthrough activity.  If she has a relapse or breakthrough activity on MRI, we will start a disease modifying therapy and will go over options at that time..   2.    Continue Dexedrine for ADD/fatigue.  I will call in a higher dose as she is noting more cognitive disturbance at work 3.    Continue vesicare for the bladder spasms/frequency and tamsulosin for hesitancy.  Return to urology if symptoms worsen  4.    Stay active and exercise as tolerated.   5.   Continue meloxicam for arthritic pain and sertraline for mood. \ 6.  She will return to see me in 6 months or sooner if she has new or worsening neurologic symptoms.   Charletta Voight  A. Felecia Shelling, MD, PhD 37/62/8315, 17:61 PM Certified in Neurology, Clinical Neurophysiology, Sleep Medicine, Pain Medicine and Neuroimaging  [facility] [address] [place], [place] [address] [phone]

## 2020-07-14 ENCOUNTER — Telehealth: Payer: Self-pay | Admitting: Neurology

## 2020-07-14 NOTE — Telephone Encounter (Signed)
no to the covid questions MR Brain w/wo contrast & MR Cervical spine w/wo contrast Dr. Cheree Ditto Auth: [id number] (exp. 07/14/20 to 01/09/21) & Novella Rob: [id number] (exp. 07/14/20 to 10/12/20). Patient is scheduled at [facility] for 07/16/20.

## 2020-07-16 ENCOUNTER — Other Ambulatory Visit: Payer: Self-pay

## 2020-07-16 ENCOUNTER — Ambulatory Visit (INDEPENDENT_AMBULATORY_CARE_PROVIDER_SITE_OTHER): Payer: BC Managed Care – PPO

## 2020-07-16 DIAGNOSIS — G35 Multiple sclerosis: Secondary | ICD-10-CM | POA: Diagnosis not present

## 2020-07-16 MED ORDER — GADOBENATE DIMEGLUMINE 529 MG/ML IV SOLN
20.0000 mL | Freq: Once | INTRAVENOUS | Status: AC | PRN
  Administered 2020-07-16: 20 mL via INTRAVENOUS

## 2020-07-18 NOTE — Telephone Encounter (Addendum)
Received the following response: "Your PA request has been closed. Dexedrine 10MG  OR CP24- No PA required. Refill too soon; aborting - MH, 07/18/2020 10:21 AM"  Called [facility] to see when pt last refilled medication. Spoke with rep. She states they are showing pt last picked up prescription 12/07/19 #90. Unsure why PA cx out stating refill too soon. They will call pharmacy help desk number but states there is no guarantee they will call today.  Resubmitted PA on CMM. Key: [id number].  PA Case ID: [id number]. Advised previous PA stated pt trying to refill too soon. Advised pharmacy confirmed she last refilled 12/07/19 #90, not too soon. Waiting on determination.

## 2020-07-18 NOTE — Telephone Encounter (Signed)
Submitted PA on CMM.  Key: [id number] - PA Case ID: [id number]. Marked urgent. Waiting on determination.

## 2020-08-07 ENCOUNTER — Other Ambulatory Visit: Payer: Self-pay | Admitting: Neurology

## 2020-08-11 MED ORDER — DEXTROAMPHETAMINE SULFATE ER 15 MG PO CP24
ORAL_CAPSULE | ORAL | 0 refills | Status: DC
Start: 2020-08-11 — End: 2021-01-26

## 2020-08-11 NOTE — Telephone Encounter (Signed)
Checked drug registry. Showing she last refilled rx 12/07/19 #90. Refill sent 06/23/20 to [facility] #[id number]- [place]. Called pharmacy at [phone]. Spoke with tech. Pt last refilled rx 12/07/19. Stating PA needed for qty limit override. I advised we completed PA and it was approved, provided approval dates. She states we will have to call to get this worked out. I recommended again they call pharmacy help desk # to work this out, I was told they would call back on 07/18/20. Does not appear this was completed. I reviewed PA that was completed and approved and confirmed it was for #90/30days. I asked tech to f/u with pharmacy help desk number today. She states she will later on, states they are very busy.    Called [facility]. They confirmed PA is on file and approved.  Confirmed there is an override on file. She states it look like dextroamphetamine tablets 10mg  is what pharmacy is trying to fill, not dextroamphetamine 10mg  cap 24hr. [place] that will be accepted and will get paid claim: [id number].   Spoke with MD. Will increase dose from 10 to 15mg  24hr cap.

## 2020-08-11 NOTE — Telephone Encounter (Signed)
Initiated urgent PA on CMM for dextroamphetamine 15mg  24hr capsule. Key: [place]. Waiting on clinical questions to be available to complete from [facility].

## 2020-08-11 NOTE — Addendum Note (Signed)
Addended by: Wyvonnia Lora on: 08/11/2020 01:01 PM   Modules accepted: Orders

## 2020-08-11 NOTE — Telephone Encounter (Signed)
Clinical questions completed and submitted. Waiting on determination from [place].

## 2020-08-18 ENCOUNTER — Other Ambulatory Visit: Payer: Self-pay | Admitting: *Deleted

## 2020-08-18 DIAGNOSIS — M5030 Other cervical disc degeneration, unspecified cervical region: Secondary | ICD-10-CM

## 2020-08-18 DIAGNOSIS — M4800 Spinal stenosis, site unspecified: Secondary | ICD-10-CM

## 2020-08-18 DIAGNOSIS — M5020 Other cervical disc displacement, unspecified cervical region: Secondary | ICD-10-CM

## 2020-08-25 ENCOUNTER — Other Ambulatory Visit: Payer: Self-pay | Admitting: Neurology

## 2020-10-17 ENCOUNTER — Other Ambulatory Visit: Payer: Self-pay | Admitting: Neurology

## 2020-10-17 DIAGNOSIS — M4807 Spinal stenosis, lumbosacral region: Secondary | ICD-10-CM

## 2020-10-17 MED ORDER — TRAMADOL HCL 50 MG PO TABS: 50.0000 mg | ORAL_TABLET | Freq: Four times a day (QID) | ORAL | 0 refills | Status: DC | PRN

## 2020-10-21 ENCOUNTER — Telehealth: Payer: Self-pay | Admitting: Neurology

## 2020-10-21 NOTE — Telephone Encounter (Signed)
[employer] pending faxed notes  

## 2020-10-21 NOTE — Telephone Encounter (Signed)
Good morning,  Do you still have [employer]? If so can you give me your new member ID number. Do you also have [employer]?   Thank you, Raquel Sarna

## 2020-10-27 NOTE — Telephone Encounter (Signed)
LVM for pt to call back about scheduling mri  10/27/20 Allison Guzman: [id number] (exp. 10/27/20 to 01/19/21)

## 2020-10-27 NOTE — Telephone Encounter (Signed)
Patient returned my call she is scheduled at [facility] for 10/29/20.

## 2020-10-29 ENCOUNTER — Other Ambulatory Visit: Payer: Self-pay

## 2020-10-29 ENCOUNTER — Ambulatory Visit: Payer: Managed Care, Other (non HMO)

## 2020-10-29 DIAGNOSIS — M4807 Spinal stenosis, lumbosacral region: Secondary | ICD-10-CM

## 2020-10-31 ENCOUNTER — Telehealth: Payer: Self-pay | Admitting: Neurology

## 2020-10-31 DIAGNOSIS — M47816 Spondylosis without myelopathy or radiculopathy, lumbar region: Secondary | ICD-10-CM

## 2020-10-31 NOTE — Telephone Encounter (Signed)
I discussed the results of the MRI with Allison Guzman.  She has facet hypertrophy at L4-L5 with a joint effusion on the right.  She has milder degenerative changes at L5-S1.  She describes her pain as being bilaterally across the lower back and is worse when she is standing up straight and better when she leans forward.  This could be consistent with a facet syndrome.  She is only having mild benefit from meloxicam.  I will order bilateral L4-L5 medial branch blocks.  If successful then radiofrequency ablation can be considered.

## 2020-11-04 ENCOUNTER — Other Ambulatory Visit: Payer: Self-pay | Admitting: Neurology

## 2020-11-04 DIAGNOSIS — M47816 Spondylosis without myelopathy or radiculopathy, lumbar region: Secondary | ICD-10-CM

## 2020-11-07 ENCOUNTER — Other Ambulatory Visit: Payer: Managed Care, Other (non HMO)

## 2020-11-14 ENCOUNTER — Other Ambulatory Visit: Payer: Self-pay | Admitting: Neurology

## 2020-11-17 ENCOUNTER — Other Ambulatory Visit: Payer: Self-pay | Admitting: Neurology

## 2020-11-17 ENCOUNTER — Ambulatory Visit
Admission: RE | Admit: 2020-11-17 | Discharge: 2020-11-17 | Disposition: A | Discharge status: Home / Self Care | Payer: Managed Care, Other (non HMO) | Source: Ambulatory Visit | Attending: Neurology | Admitting: Neurology

## 2020-11-17 ENCOUNTER — Other Ambulatory Visit: Payer: Self-pay

## 2020-11-17 DIAGNOSIS — M47816 Spondylosis without myelopathy or radiculopathy, lumbar region: Secondary | ICD-10-CM

## 2020-11-17 NOTE — Discharge Instructions (Signed)

## 2020-11-19 ENCOUNTER — Telehealth: Payer: Self-pay | Admitting: Neurology

## 2020-11-19 DIAGNOSIS — M47816 Spondylosis without myelopathy or radiculopathy, lumbar region: Secondary | ICD-10-CM

## 2020-11-19 NOTE — Telephone Encounter (Signed)
Called [place] back. Patient's insurance denied radiofrequency ablation. Requiring that she complete 2 rounds of facet/medial branch blocks before covering. Wondering if Dr. Felecia Shelling wants to do another medial branch block or do P2P to try and get radiofrequency ablation approved. Pt has [employer]. ID: [id number]. Phone# to [place] to do P2P: [phone]. Case# [id number]. Advised I will discuss w/ MD and call back to let her know plan.

## 2020-11-19 NOTE — Telephone Encounter (Addendum)
Per Dr. Felecia Shelling, he would like to go ahead and do another round of facet/medial branch blocks prior to radiofrequency ablation. I called Anderson Malta back at [facility]. She request we place orders for medial branch blocks. I placed orders.

## 2020-11-19 NOTE — Addendum Note (Signed)
Addended by: Wyvonnia Lora on: 11/19/2020 10:16 AM   Modules accepted: Orders

## 2020-11-19 NOTE — Telephone Encounter (Signed)
Anderson Malta from GI states they need an order for another procedure before they're able to do the frequency.   Best contact: [phone]

## 2020-11-25 ENCOUNTER — Other Ambulatory Visit: Payer: Self-pay | Admitting: Neurology

## 2020-11-25 ENCOUNTER — Ambulatory Visit
Admission: RE | Admit: 2020-11-25 | Discharge: 2020-11-25 | Disposition: A | Discharge status: Home / Self Care | Payer: Managed Care, Other (non HMO) | Source: Ambulatory Visit | Attending: Neurology | Admitting: Neurology

## 2020-11-25 ENCOUNTER — Other Ambulatory Visit: Payer: Managed Care, Other (non HMO)

## 2020-11-25 DIAGNOSIS — M47816 Spondylosis without myelopathy or radiculopathy, lumbar region: Secondary | ICD-10-CM

## 2020-11-25 NOTE — Discharge Instructions (Signed)

## 2020-12-02 ENCOUNTER — Inpatient Hospital Stay: Admission: RE | Admit: 2020-12-02 | Payer: Managed Care, Other (non HMO) | Source: Ambulatory Visit

## 2020-12-02 ENCOUNTER — Other Ambulatory Visit: Payer: Self-pay | Admitting: Neurology

## 2020-12-02 ENCOUNTER — Ambulatory Visit
Admission: RE | Admit: 2020-12-02 | Discharge: 2020-12-02 | Disposition: A | Discharge status: Home / Self Care | Payer: Managed Care, Other (non HMO) | Source: Ambulatory Visit | Attending: Neurology | Admitting: Neurology

## 2020-12-02 DIAGNOSIS — M47816 Spondylosis without myelopathy or radiculopathy, lumbar region: Secondary | ICD-10-CM

## 2020-12-02 MED ORDER — FENTANYL CITRATE (PF) 100 MCG/2ML IJ SOLN
25.0000 ug | INTRAMUSCULAR | Status: DC | PRN
Start: 2020-12-02 — End: 2020-12-03
  Administered 2020-12-02 (×2): 50 ug via INTRAVENOUS

## 2020-12-02 MED ORDER — MIDAZOLAM HCL 2 MG/2ML IJ SOLN
1.0000 mg | INTRAMUSCULAR | Status: DC | PRN
  Administered 2020-12-02: 1 mg via INTRAVENOUS
  Administered 2020-12-02: 0.5 mg via INTRAVENOUS

## 2020-12-02 MED ORDER — SODIUM CHLORIDE 0.9 % IV SOLN: INTRAVENOUS | Status: DC

## 2020-12-02 MED ORDER — KETOROLAC TROMETHAMINE 30 MG/ML IJ SOLN
30.0000 mg | Freq: Once | INTRAMUSCULAR | Status: AC
  Administered 2020-12-02: 30 mg via INTRAVENOUS

## 2020-12-02 NOTE — Progress Notes (Signed)
Pt brought back to nursing recovery area after RFA with moderate sedation. Pt drowsy but awake, pt able to follow commands and talking in complete sentences. Pt denies any pain at this time. Will continue to monitor

## 2020-12-02 NOTE — Discharge Instructions (Signed)
Radio Frequency Ablation Post Procedure Discharge Instructions ° °1. May resume a regular diet and any medications that you routinely take (including pain medications). °2. No driving day of procedure. °3. Upon discharge go home and rest for at least 4 hours.  May use an ice pack as needed to injection sites on back. °4. Remove bandades later, today. ° ° ° °Please contact our office at [phone] for the following symptoms: ° °· Fever greater than 100 degrees °· Increased swelling, pain, or redness at injection site. ° ° °Thank you for visiting [facility]. °

## 2020-12-02 NOTE — Discharge Instr - Other Orders (Signed)
Pt alert with no complaints. Dr. Nelia Shi at the bedside.

## 2020-12-10 ENCOUNTER — Other Ambulatory Visit: Payer: Managed Care, Other (non HMO)

## 2020-12-12 ENCOUNTER — Other Ambulatory Visit: Payer: Managed Care, Other (non HMO)

## 2020-12-15 ENCOUNTER — Other Ambulatory Visit: Payer: Self-pay | Admitting: Neurology

## 2021-01-26 ENCOUNTER — Ambulatory Visit: Payer: Managed Care, Other (non HMO) | Admitting: Neurology

## 2021-01-26 ENCOUNTER — Other Ambulatory Visit: Payer: Self-pay

## 2021-01-26 ENCOUNTER — Encounter: Payer: Self-pay | Admitting: Neurology

## 2021-01-26 VITALS — BP 95/60 | HR 100 | Ht 61.0 in | Wt 257.0 lb

## 2021-01-26 DIAGNOSIS — Z79899 Other long term (current) drug therapy: Secondary | ICD-10-CM | POA: Diagnosis not present

## 2021-01-26 DIAGNOSIS — F988 Other specified behavioral and emotional disorders with onset usually occurring in childhood and adolescence: Secondary | ICD-10-CM

## 2021-01-26 DIAGNOSIS — G35 Multiple sclerosis: Secondary | ICD-10-CM

## 2021-01-26 DIAGNOSIS — R269 Unspecified abnormalities of gait and mobility: Secondary | ICD-10-CM

## 2021-01-26 DIAGNOSIS — G35D Multiple sclerosis, unspecified: Secondary | ICD-10-CM

## 2021-01-26 DIAGNOSIS — M47816 Spondylosis without myelopathy or radiculopathy, lumbar region: Secondary | ICD-10-CM | POA: Diagnosis not present

## 2021-01-26 DIAGNOSIS — R350 Frequency of micturition: Secondary | ICD-10-CM

## 2021-01-26 DIAGNOSIS — R5382 Chronic fatigue, unspecified: Secondary | ICD-10-CM | POA: Diagnosis not present

## 2021-01-26 MED ORDER — DEXTROAMPHETAMINE SULFATE ER 15 MG PO CP24: ORAL_CAPSULE | ORAL | 0 refills | Status: DC

## 2021-01-26 MED ORDER — MELOXICAM 15 MG PO TABS: ORAL_TABLET | ORAL | 5 refills | Status: DC

## 2021-01-26 NOTE — Addendum Note (Signed)
Addended by: Arlice Colt A on: 01/26/2021 10:49 AM   Modules accepted: Level of Service
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
## 2021-03-19 ENCOUNTER — Ambulatory Visit: Payer: BC Managed Care – PPO | Admitting: Obstetrics & Gynecology

## 2021-04-09 ENCOUNTER — Other Ambulatory Visit: Payer: Self-pay | Admitting: Neurology

## 2021-04-09 MED ORDER — INDOMETHACIN 25 MG PO CAPS: 25.0000 mg | ORAL_CAPSULE | Freq: Three times a day (TID) | ORAL | 5 refills | Status: DC

## 2021-05-11 ENCOUNTER — Other Ambulatory Visit: Payer: Self-pay | Admitting: Neurology

## 2021-05-11 MED ORDER — ETODOLAC 400 MG PO TABS: 400.0000 mg | ORAL_TABLET | Freq: Two times a day (BID) | ORAL | 5 refills | Status: DC

## 2021-05-21 ENCOUNTER — Other Ambulatory Visit: Payer: Self-pay | Admitting: *Deleted

## 2021-05-21 MED ORDER — INDOMETHACIN 25 MG PO CAPS: 25.0000 mg | ORAL_CAPSULE | Freq: Four times a day (QID) | ORAL | 5 refills | Status: DC | PRN

## 2021-06-10 ENCOUNTER — Other Ambulatory Visit: Payer: Self-pay | Admitting: *Deleted

## 2021-06-10 DIAGNOSIS — M5020 Other cervical disc displacement, unspecified cervical region: Secondary | ICD-10-CM

## 2021-06-10 DIAGNOSIS — R208 Other disturbances of skin sensation: Secondary | ICD-10-CM

## 2021-06-10 DIAGNOSIS — M5030 Other cervical disc degeneration, unspecified cervical region: Secondary | ICD-10-CM

## 2021-06-10 DIAGNOSIS — M4807 Spinal stenosis, lumbosacral region: Secondary | ICD-10-CM

## 2021-06-10 DIAGNOSIS — M47816 Spondylosis without myelopathy or radiculopathy, lumbar region: Secondary | ICD-10-CM

## 2021-06-10 DIAGNOSIS — M4800 Spinal stenosis, site unspecified: Secondary | ICD-10-CM

## 2021-06-11 ENCOUNTER — Telehealth: Payer: Self-pay | Admitting: Neurology

## 2021-06-11 NOTE — Telephone Encounter (Signed)
Sent to [facility] ph # [phone]. 

## 2021-06-16 ENCOUNTER — Other Ambulatory Visit: Payer: Self-pay

## 2021-06-16 ENCOUNTER — Encounter: Payer: Self-pay | Admitting: Obstetrics & Gynecology

## 2021-06-16 ENCOUNTER — Other Ambulatory Visit (HOSPITAL_COMMUNITY)
Admission: RE | Admit: 2021-06-16 | Discharge: 2021-06-16 | Disposition: A | Discharge status: Home / Self Care | Payer: Managed Care, Other (non HMO) | Source: Ambulatory Visit | Attending: Obstetrics & Gynecology | Admitting: Obstetrics & Gynecology

## 2021-06-16 ENCOUNTER — Ambulatory Visit (INDEPENDENT_AMBULATORY_CARE_PROVIDER_SITE_OTHER): Payer: Managed Care, Other (non HMO) | Admitting: Obstetrics & Gynecology

## 2021-06-16 VITALS — BP 108/64 | HR 100 | Resp 16 | Ht 60.75 in | Wt 263.0 lb

## 2021-06-16 DIAGNOSIS — Z01419 Encounter for gynecological examination (general) (routine) without abnormal findings: Secondary | ICD-10-CM | POA: Diagnosis not present

## 2021-06-16 DIAGNOSIS — Z6841 Body Mass Index (BMI) 40.0 and over, adult: Secondary | ICD-10-CM

## 2021-06-16 DIAGNOSIS — Z30431 Encounter for routine checking of intrauterine contraceptive device: Secondary | ICD-10-CM

## 2021-06-16 NOTE — Progress Notes (Signed)
Allison Guzman 01/28/1971 [id number]   History:    50 y.o. G2P2L2 Married   RP:  Established patient presenting for annual gyn exam    HPI: Well on Mirena IUD x 06/2018.  No BTB.  No pelvic pain.  No pain with IC.  Normal vaginal secretions.  Urine normal.  Fighting constipation.  No Colono yet.  Breasts normal.  BMI 50.1.  Needs to decrease calories.  Improving her fitness with a Total Gym at home.  Health labs with Fam MD.      Past medical history,surgical history, family history and social history were all reviewed and documented in the EPIC chart.  Gynecologic History No LMP recorded. (Menstrual status: IUD).   Obstetric History OB History  Gravida Para Term Preterm AB Living  2 2       2   SAB IAB Ectopic Multiple Live Births               # Outcome Date GA Lbr Len/2nd Weight Sex Delivery Anes PTL Lv  2 Para           1 Para              ROS: A ROS was performed and pertinent positives and negatives are included in the history.  GENERAL: No fevers or chills. HEENT: No change in vision, no earache, sore throat or sinus congestion. NECK: No pain or stiffness. CARDIOVASCULAR: No chest pain or pressure. No palpitations. PULMONARY: No shortness of breath, cough or wheeze. GASTROINTESTINAL: No abdominal pain, nausea, vomiting or diarrhea, melena or bright red blood per rectum. GENITOURINARY: No urinary frequency, urgency, hesitancy or dysuria. MUSCULOSKELETAL: No joint or muscle pain, no back pain, no recent trauma. DERMATOLOGIC: No rash, no itching, no lesions. ENDOCRINE: No polyuria, polydipsia, no heat or cold intolerance. No recent change in weight. HEMATOLOGICAL: No anemia or easy bruising or bleeding. NEUROLOGIC: No headache, seizures, numbness, tingling or weakness. PSYCHIATRIC: No depression, no loss of interest in normal activity or change in sleep pattern.     Exam:   BP 108/64   Pulse 100   Resp 16   Ht 5' 0.75" (1.543 m)   Wt 263 lb (119.3 kg)   BMI 50.10  kg/m   Body mass index is 50.1 kg/m.  General appearance : Well developed well nourished female. No acute distress HEENT: Eyes: no retinal hemorrhage or exudates,  Neck supple, trachea midline, no carotid bruits, no thyroidmegaly Lungs: Clear to auscultation, no rhonchi or wheezes, or rib retractions  Heart: Regular rate and rhythm, no murmurs or gallops Breast:Examined in sitting and supine position were symmetrical in appearance, no palpable masses or tenderness,  no skin retraction, no nipple inversion, no nipple discharge, no skin discoloration, no axillary or supraclavicular lymphadenopathy Abdomen: no palpable masses or tenderness, no rebound or guarding Extremities: no edema or skin discoloration or tenderness  Pelvic: Vulva: Normal             Vagina: No gross lesions or discharge  Cervix: No gross lesions or discharge.  Pap reflex done.  Uterus  AV, normal size, shape and consistency, non-tender and mobile  Adnexa  Without masses or tenderness  Anus: Normal   Assessment/Plan:  50 y.o. female for annual exam   1. Encounter for routine gynecological examination with Papanicolaou smear of cervix Normal gynecologic exam.  Pap reflex done.  Breast exam normal.  Schedule Mammo.  Schedule Colono.  Health labs with family physician. - Cytology - PAP( [facility])  2. Encounter for routine checking of intrauterine contraceptive device (IUD) Well on Mirena IUD x 06/2018.  IUD in good position.  3. Class 3 severe obesity due to excess calories with serious comorbidity and body mass index (BMI) of 50.0 to 59.9 in adult [facility]) Refer to [facility] weight loss program.  Other orders - Levothyroxine Sodium 88 MCG CAPS; Take 2 capsules by mouth daily. - polyethylene glycol powder (GLYCOLAX/MIRALAX) 17 GM/SCOOP powder; Take by mouth.   Princess Bruins MD, 1:46 PM 06/16/2021

## 2021-06-17 ENCOUNTER — Other Ambulatory Visit: Payer: Self-pay | Admitting: *Deleted

## 2021-06-17 LAB — CYTOLOGY - PAP: Diagnosis: NEGATIVE

## 2021-06-17 MED ORDER — DEXTROAMPHETAMINE SULFATE ER 15 MG PO CP24: ORAL_CAPSULE | ORAL | 0 refills | Status: DC

## 2021-06-19 ENCOUNTER — Encounter: Payer: Self-pay | Admitting: Obstetrics & Gynecology

## 2021-06-29 ENCOUNTER — Encounter: Payer: Self-pay | Admitting: *Deleted

## 2021-07-31 ENCOUNTER — Encounter: Payer: Self-pay | Admitting: Neurology

## 2021-07-31 DIAGNOSIS — Z0000 Encounter for general adult medical examination without abnormal findings: Secondary | ICD-10-CM | POA: Insufficient documentation

## 2021-07-31 DIAGNOSIS — T148XXA Other injury of unspecified body region, initial encounter: Secondary | ICD-10-CM | POA: Insufficient documentation

## 2021-07-31 DIAGNOSIS — F902 Attention-deficit hyperactivity disorder, combined type: Secondary | ICD-10-CM | POA: Insufficient documentation

## 2021-08-04 ENCOUNTER — Other Ambulatory Visit: Payer: Self-pay | Admitting: Neurology

## 2021-08-05 ENCOUNTER — Ambulatory Visit: Payer: Managed Care, Other (non HMO) | Admitting: Neurology

## 2021-08-05 ENCOUNTER — Encounter: Payer: Self-pay | Admitting: Neurology

## 2021-08-05 ENCOUNTER — Other Ambulatory Visit: Payer: Self-pay

## 2021-08-05 VITALS — BP 126/71 | HR 84 | Ht 61.0 in | Wt 265.0 lb

## 2021-08-05 DIAGNOSIS — R208 Other disturbances of skin sensation: Secondary | ICD-10-CM

## 2021-08-05 DIAGNOSIS — G35 Multiple sclerosis: Secondary | ICD-10-CM

## 2021-08-05 DIAGNOSIS — R5382 Chronic fatigue, unspecified: Secondary | ICD-10-CM | POA: Diagnosis not present

## 2021-08-05 DIAGNOSIS — F09 Unspecified mental disorder due to known physiological condition: Secondary | ICD-10-CM

## 2021-08-05 DIAGNOSIS — R350 Frequency of micturition: Secondary | ICD-10-CM

## 2021-08-05 DIAGNOSIS — F988 Other specified behavioral and emotional disorders with onset usually occurring in childhood and adolescence: Secondary | ICD-10-CM

## 2021-08-05 DIAGNOSIS — R269 Unspecified abnormalities of gait and mobility: Secondary | ICD-10-CM

## 2021-08-05 DIAGNOSIS — M47816 Spondylosis without myelopathy or radiculopathy, lumbar region: Secondary | ICD-10-CM

## 2021-08-05 NOTE — Progress Notes (Signed)
[facility]  PATIENT: Allison Guzman DOB: [birth year]  REFERRING CLINICIAN:  Heather Spry HISTORY FROM:  patient  REASON FOR VISIT:  Multiple sclerosis.   HISTORICAL  CHIEF COMPLAINT:  Chief Complaint  Patient presents with   Follow-up    Rm 1, pt alone, here for MS follow up. Overall stable. She is still using cane. [name] is treatment    HISTORY OF PRESENT ILLNESS:  Mrs. Cancro is a 50 year old woman with relapsing remitting MS.  Update 08/05/2021 She has completed the required REMS monitoring after [name] -- Her second course of [name] was November 2016   She has not had any exacerbations.   She has had more difficulty with her gait and stumbles more.    Her right leg is a little worse than her legt but she also has right knee issues.  She has not seen Orthopedics this year and plans to make an appointment.    Gait is affected by both the MS and her degenerative issues.  Balance is reduced.  Dalfampridine has helped some She tries to avoid stairs and needs to hold the bannister.     Strength and sensation are doing well.    She has urinary frequency, helped by solifenacin.  She reports reduced color vision on the left.  Visual acuity is normal.   She notes some diplopia if she looks far left while driving looking over shoulder.   She has more fatigue   Dexedrine had helped but she rarely takes.     She sleeps well most nights.  She notes some depression and more anxiety (after job change).   Sertraline is mildly better than Celexa.   Abilify caused weight gain and she sropped.   Cognition seemed better with Dexedrine as well.     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   She was let go due to cognitive concerns and was told she was not catching on with the necessary tasks of the job.      She has started the disability process due to her worsening physical and cognitive  issues.   She had a one hour neurocognitive evaluation (video) and was referred to a DC for her gait and pain issues.       Lower back pain is axial.   MBB's helped and she  proceeded to RFA with benefit.      MS History:   She was diagnosed with relapsing remitting MS in 2004 after presenting with diplopia and vertigo. She had MRI/CSF c/w MS.   She had a five-day course of IV steroids.    Dr. Brett Fairy placed her on Rebif.  She had a couple of exacerbations while on Rebif with some effect on her gait.   She trandferred care to me.  She was switched to Copaxone plus estriol. Additionally, bwas tried for short while.  Unfortunately, she continued to have breakthrough exacerbations. Around 2008, she started Tysabri. She did fairly well on Tysabri with one definite exacerbation in 2012.    She was also JCV antibody positive. Therefore, she opted to switch to Gilenya.   However, she continued to have exacerbations and disease activity on MRI and she switched to Tecfidera.   For about one year she was on Tecfidera but had a lot of GI side effects with nausea. Additionally she had breakthrough disease. December 2015 she received Lemtrada at 12 mg per day  for 5 days. She tolerated the infusion fairly well with no significant systemic side effects or allergic reactions.   We had previously discussed the risk of autoimmune disease with Lemtrada. She is post operative with thyroidectomy due to thyroid cancer.   A little tissue remains but is being suppressed.   She is on supplementation.   MRI 06/05/15 showed 3 small enhancing lesions and she received several days IV steroid at that time.   She hd her second course of Lemtrada in December 2016 x 3 days.   IMAGING MRI Brain 07/16/2020 showed T2/FLAIR hyperintense foci in the hemispheres, brainstem and cerebellum in a pattern and configuration consistent with chronic demyelinating plaque associated with multiple sclerosis.  None of the foci appear to be acute.  They do  not enhance.  Compared to the MRI from 06/13/2019, there are no new lesions.      The pituitary gland is thinned within a large sella turcica consistent with a partially empty sella turcica.  This looks unchanged compared to the 2020 MRI.  This is most likely an incidental finding but could also be seen with elevated intracranial pressures.       There is a normal enhancement pattern and there are no acute findings.   MRI brain 06/13/2019 showed  multiple foci within the hemispheres, brainstem and cerebellum in a pattern and configuration consistent with chronic demyelinating plaque associated with multiple sclerosis.  Compared to the MRI dated 06/07/2018, there are no new lesions.  None of the foci enhances.    MRI brain 06/07/2018 showed Multiple T2/FLAIR hyperintense foci in the hemispheres, brainstem and spinal cord in a pattern and configuration consistent with chronic demyelinating plaque associated with multiple sclerosis.  None of the foci appears to be acute.  When compared to the MRI dated 06/29/2017, there is no interval change   MRI cervical spine 07/16/2020 showed Several T2 hyperintense foci within the spinal cord.  None of the foci appear to be acute and they do not enhance.  They were all present on the MRI from 2005 and are consistent with chronic demyelinating plaque.       Mild multilevel degenerative changes as detailed above.  Although there is mild spinal stenosis at C3-C4 and C6-C7 and borderline spinal stenosis at C4-C5, there is no nerve root compression at any of the cervical levels.   Normal enhancement pattern.  MRI cervical spine 03/21/2004 shows multifocal cervical cord involvement by MS plaques, most prominent at the C6-7 level particularly towards the left. Foci are at C2-3, C4-5, C6-7 and T1-2 levels.  Interestingly at this same level the patient has a shallow broad based disc protrusion that effaces the ventral subarachnoid space.    MRI lumbar spine 10/29/2020 showed  Subtle  T2 hyperintense focus within the spinal cord on sagittal STIR images adjacent to T11 could represent a chronic demyelinating plaque associated with her known multiple sclerosis.  At L3-L4, there are minimal to mild degenerative changes but no nerve root compression or spinal stenosis.   At L4-L5, there is moderate right greater than left facet hypertrophy with a large right joint effusion and mild ligamenta flava hypertrophy.  There is no nerve root compression or spinal stenosis.   At L5-S1, there is mild disc bulging and small joint effusions.  No nerve root compression or spinal stenosis.   REVIEW OF SYSTEMS:  Constitutional: No fevers, chills, sweats, or change in appetite.  She has fatigue and hypersomnia Eyes:  She notes double vision.  No eye pain Ear, nose and throat: No hearing loss, ear pain, nasal congestion, sore throat Cardiovascular: No chest pain, palpitations Respiratory:  No shortness of breath at rest or with exertion.   No wheezes GastrointestinaI: No nausea, vomiting, diarrhea, abdominal pain, fecal incontinence Genitourinary:  see above. Musculoskeletal:  No neck pain, back pain Integumentary: No rash, pruritus, skin lesions Neurological: as above Psychiatric: No depression at this time (some in past)  No anxiety Endocrine: No palpitations, diaphoresis, change in appetite, change in weigh or increased thirst Hematologic/Lymphatic:  No anemia, purpura, petechiae. Allergic/Immunologic: No itchy/runny eyes, nasal congestion, recent allergic reactions, rashes  ALLERGIES: Allergies  Allergen Reactions   Penicillins Other (See Comments)    Causes yeast infections    HOME MEDICATIONS: Outpatient Medications Prior to Visit  Medication Sig Dispense Refill   dalfampridine 10 MG TB12 TAKE 1 TABLET EVERY 12 HOURS 180 tablet 3   dextroamphetamine (DEXEDRINE SPANSULE) 15 MG 24 hr capsule Take 3 capsules (45mg ) by mouth daily 90 capsule 0   Levothyroxine Sodium (TIROSINT) 88 MCG  CAPS Take 2 capsules by mouth daily.     omeprazole (PRILOSEC) 40 MG capsule Take 40 mg by mouth daily.   11   polyethylene glycol powder (GLYCOLAX/MIRALAX) 17 GM/SCOOP powder Take by mouth.     sertraline (ZOLOFT) 100 MG tablet TAKE 1 TABLET(100 MG) BY MOUTH DAILY 90 tablet 1   solifenacin (VESICARE) 10 MG tablet TAKE 1 TABLET(10 MG) BY MOUTH DAILY 30 tablet 11   traMADol (ULTRAM) 50 MG tablet Take 1 tablet (50 mg total) by mouth every 6 (six) hours as needed. 90 tablet 0   Levothyroxine Sodium 88 MCG CAPS Take 2 capsules by mouth daily.     Facility-Administered Medications Prior to Visit  Medication Dose Route Frequency Provider Last Rate Last Admin   gadopentetate dimeglumine (MAGNEVIST) injection 20 mL  20 mL Intravenous Once PRN Lewi Drost, Nanine Means, MD        PAST MEDICAL HISTORY: Past Medical History:  Diagnosis Date   Anemia    Arthritis    Cancer ([place])    Common migraine 06/16/2017   Movement disorder    Multiple sclerosis ([place])    Thyroid cancer ([place])    2010   Thyroid disease    Vision abnormalities     PAST SURGICAL HISTORY: Past Surgical History:  Procedure Laterality Date   CESAREAN SECTION     IUD REMOVAL N/A 12/27/2014   Procedure: INTRAUTERINE DEVICE (IUD) REMOVAL;  Surgeon: Princess Bruins, MD;  Location: [facility];  Service: Gynecology;  Laterality: N/A;   JOINT REPLACEMENT     KNEE SURGERY     LAPAROSCOPY N/A 12/27/2014   Procedure: LAPAROSCOPY DIAGNOSTIC ;  Surgeon: Princess Bruins, MD;  Location: [facility];  Service: Gynecology;  Laterality: N/A;   PATELLA RECONSTRUCTION     spinal ablation     THYROIDECTOMY      FAMILY HISTORY: Family History  Problem Relation Age of Onset   Breast cancer Maternal Grandmother 52   Cancer Maternal Grandfather        bone    Heart disease Paternal Grandmother    Heart disease Paternal Grandfather    Healthy Mother    Cancer Father        throat    Healthy Brother    Healthy Sister    Healthy Sister     SOCIAL  HISTORY:  Social History   Socioeconomic History   Marital status: Married    Spouse name: Not  on file   Number of children: Not on file   Years of education: Not on file   Highest education level: Not on file  Occupational History   Not on file  Tobacco Use   Smoking status: Former    Types: Cigarettes    Quit date: 10/02/2003    Years since quitting: 17.8   Smokeless tobacco: Never  Vaping Use   Vaping Use: Never used  Substance and Sexual Activity   Alcohol use: No    Alcohol/week: 0.0 standard drinks   Drug use: No   Sexual activity: Yes    Partners: Male    Birth control/protection: Surgical    Comment: 1st intercourse- 16, partners- 29, married- 32 yrs, BTL  Other Topics Concern   Not on file  Social History Narrative   Not on file   Social Determinants of Health   Financial Resource Strain: Not on file  Food Insecurity: Not on file  Transportation Needs: Not on file  Physical Activity: Not on file  Stress: Not on file  Social Connections: Not on file  Intimate Partner Violence: Not on file     PHYSICAL EXAM  Vitals:   08/05/21 1055  BP: 126/71  Pulse: 84  Weight: 265 lb (120.2 kg)  Height: 5\' 1"  (1.549 m)    Body mass index is 50.07 kg/m.    General: The patient is an overweight woman in no acute distress  Neurologic Exam  Mental status: The patient is alert and oriented x 3 at the time of the examination. The patient has apparent normal recent and remote memory, with reduced attention span and concentration ability.   Speech is normal.  Cranial nerves: There is mild disconjugate gaze looking for left..  Color vision is reduced OS.  Visual acuity was symmetric.    Facial strength is normal.  Hearing appears to be normal.  Motor:  Muscle bulk is normal.  She has increased muscle tone in the legs.  Strength is 5/5 in the arms and 4+/5 in the legs, left worse than right  Sensory: She has reduced vibration sensation in the right knee relative to  the left.  She has reduced touch sensation in left leg relative to right.  Coordination: Cerebellar testing shows slightly reduced finger-nose-finger on the left.  She has mildly reduced heel -to-shin bilaterally  Gait and station: Station is slightly wide.  She has a poor gait that is wide with a right foot drop.  She is unable to do a tandem walk.  She has difficulty walking without her cane but can take steps inside the room.     Her Romberg is positive  Reflexes: Deep tendon reflexes are symmetric and brisk bilaterally.  No ankle clonus.    ASSESSMENT AND PLAN  Multiple sclerosis (HCC)  Chronic fatigue  Dysesthesia  Lumbar facet joint syndrome  Abnormality of gait  Attention deficit disorder, unspecified hyperactivity presence  Urinary frequency    1.  She did her 2 years of Lemtrada in 2015 in 2016 and has not had any exacerbation or new activity on MRI since.  She has completed the REMS.   We will check periodic imaging to assess for breakthrough activity.   2.    Continue  Dexedrine for ADD/fatigue.  Unfortunately, cognition has slowly worsened over last few years.   She was let go from a position earlier this year due to inability to do the cognitive part of the job 3.    Continue vesicare for  the bladder spasms/frequency and tamsulosin for hesitancy.   4.   Due to a combination of cognitive and physical impairments and fatigue., she is disabled and is unable to work.    She has had recent neuro-cognitive testing and I will review if made available.    5.  She will return to see me in 6 months or sooner if she has new or worsening neurologic symptoms.   48 minute office visit with the majority of the time spent face-to-face for history and physical, discussion/counseling and decision-making.  Additional time with record review and documentation.   Yvetta Drotar A. Felecia Shelling, MD, PhD 59/40/9050, 25:61 AM Certified in Neurology, Clinical Neurophysiology, Sleep Medicine, Pain  Medicine and Neuroimaging  [facility] [address] [place], [place] [address] [phone]

## 2021-08-12 ENCOUNTER — Encounter: Payer: Self-pay | Admitting: Neurology

## 2021-08-12 DIAGNOSIS — Z0271 Encounter for disability determination: Secondary | ICD-10-CM

## 2021-08-18 ENCOUNTER — Other Ambulatory Visit: Payer: Self-pay | Admitting: Neurology

## 2021-08-27 ENCOUNTER — Other Ambulatory Visit: Payer: Managed Care, Other (non HMO)

## 2021-08-27 ENCOUNTER — Telehealth: Payer: Self-pay | Admitting: Neurology

## 2021-08-27 NOTE — Telephone Encounter (Signed)
Allison Guzman: [id number] (exp. 08/26/21 to 02/22/22) Patient was scheduled at GI for 08/27/21, but she canceled her appt due to can not afford the exam at this time.

## 2021-08-28 ENCOUNTER — Other Ambulatory Visit: Payer: Self-pay | Admitting: Neurology

## 2021-09-21 ENCOUNTER — Ambulatory Visit
Admission: RE | Admit: 2021-09-21 | Discharge: 2021-09-21 | Disposition: A | Discharge status: Home / Self Care | Payer: Managed Care, Other (non HMO) | Source: Ambulatory Visit | Attending: Neurology | Admitting: Neurology

## 2021-09-21 ENCOUNTER — Other Ambulatory Visit: Payer: Self-pay

## 2021-09-21 DIAGNOSIS — G35 Multiple sclerosis: Secondary | ICD-10-CM

## 2021-09-21 DIAGNOSIS — R269 Unspecified abnormalities of gait and mobility: Secondary | ICD-10-CM

## 2021-09-21 DIAGNOSIS — F09 Unspecified mental disorder due to known physiological condition: Secondary | ICD-10-CM

## 2021-09-21 MED ORDER — GADOBENATE DIMEGLUMINE 529 MG/ML IV SOLN
20.0000 mL | Freq: Once | INTRAVENOUS | Status: AC | PRN
  Administered 2021-09-21: 20 mL via INTRAVENOUS

## 2021-11-11 ENCOUNTER — Encounter: Payer: Self-pay | Admitting: Neurology

## 2021-11-12 ENCOUNTER — Other Ambulatory Visit: Payer: Self-pay

## 2021-11-12 ENCOUNTER — Encounter: Payer: Self-pay | Admitting: Neurology

## 2021-11-12 ENCOUNTER — Ambulatory Visit: Payer: Managed Care, Other (non HMO) | Admitting: Neurology

## 2021-11-12 VITALS — BP 102/70 | HR 96 | Ht 61.0 in | Wt 268.5 lb

## 2021-11-12 DIAGNOSIS — R5382 Chronic fatigue, unspecified: Secondary | ICD-10-CM | POA: Diagnosis not present

## 2021-11-12 DIAGNOSIS — R208 Other disturbances of skin sensation: Secondary | ICD-10-CM

## 2021-11-12 DIAGNOSIS — F09 Unspecified mental disorder due to known physiological condition: Secondary | ICD-10-CM

## 2021-11-12 DIAGNOSIS — R269 Unspecified abnormalities of gait and mobility: Secondary | ICD-10-CM | POA: Diagnosis not present

## 2021-11-12 DIAGNOSIS — M47816 Spondylosis without myelopathy or radiculopathy, lumbar region: Secondary | ICD-10-CM

## 2021-11-12 DIAGNOSIS — G35 Multiple sclerosis: Secondary | ICD-10-CM | POA: Diagnosis not present

## 2021-11-12 DIAGNOSIS — F988 Other specified behavioral and emotional disorders with onset usually occurring in childhood and adolescence: Secondary | ICD-10-CM

## 2021-11-12 DIAGNOSIS — R350 Frequency of micturition: Secondary | ICD-10-CM

## 2021-11-12 MED ORDER — SERTRALINE HCL 100 MG PO TABS: ORAL_TABLET | ORAL | 3 refills | Status: DC

## 2021-11-12 MED ORDER — DEXTROAMPHETAMINE SULFATE ER 15 MG PO CP24: ORAL_CAPSULE | ORAL | 0 refills | Status: DC

## 2021-11-12 NOTE — Progress Notes (Signed)
[facility]  PATIENT: Allison Guzman DOB: 06/19/1971  REFERRING CLINICIAN:  Heather Spry HISTORY FROM:  patient  REASON FOR VISIT:  Multiple sclerosis.   HISTORICAL  CHIEF COMPLAINT:  Chief Complaint  Patient presents with   Follow-up    Rm 1, alone. Here for ongoing back pn. Pt stopped Tramadol, made her sleepy. Has had 2 falls since last ov, no head injury. Ambulates w cane. Has swelling L ankle>R.     HISTORY OF PRESENT ILLNESS:  Allison Guzman is a 51 year old woman with relapsing remitting MS.  Update 11/12/2021 She is having difficulty with gait.   She fell x 2 recently - no injury.  Both falls were in her home.    Her right leg is a little worse than her left but she also has right knee issues.     Gait is affected by both the MS and her degenerative issues.  Balance is reduced.  Dalfampridine has helped some She tries to avoid stairs and needs to hold the bannister.     Strength and sensation are doing well.    She has urinary frequency, helped by solifenacin.  She reports reduced color vision on the left.  Visual acuity is normal.   She notes some diplopia if she looks far left while driving looking over shoulder.   She is having more difficulty with both cognition and fatigue   Dexedrine had helped but she does not take all as prescribed as she is jittery with the full dose.     She sleeps well most nights.  She notes some depression and more anxiety (after job change).   Sertraline is mildly better than Celexa.   Abilify caused weight gain and she sropped.        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   She was let go due to cognitive concerns and was told she was not catching on with the necessary tasks of the job.      She has started the disability process due to her worsening physical and cognitive issues.   She reports having a short video cognitive evaluation as part of  the disability process but I do not have any details.  Although she has both physical and cognitive dysfunction from the MS, surprisingly she was not approved for disability and may need to appeal     She has axial lower back pain is axial.   MBB's helped and she  proceeded to RFA with benefit.      Current pain is worse on the left.     She had bilateral L3 and L4 Medial branch RFA  12/02/2021.    She felt pain was better bilaterally for  while though the left returned several months before the right.  We discussed that if benefit is short-lived, would consider referral to [facility] for fusion evaluation.     She has some right knee pain   MS History:   She was diagnosed with relapsing remitting MS in 2004 after presenting with diplopia and vertigo. She had MRI/CSF c/w MS.   She had a five-day course of IV steroids.    Dr. Brett Fairy placed her on Rebif.  She had a couple of exacerbations while on Rebif with some effect on her gait.   She trandferred care to me.  She was switched to Copaxone plus estriol. Additionally, bwas tried for short while.  Unfortunately, she continued to have breakthrough exacerbations. Around 2008, she started Tysabri. She did fairly well on Tysabri with one definite exacerbation in 2012.    She was also JCV antibody positive. Therefore, she opted to switch to Gilenya.   However, she continued to have exacerbations and disease activity on MRI and she switched to Tecfidera.   For about one year she was on Tecfidera but had a lot of GI side effects with nausea. Additionally she had breakthrough disease. December 2015 she received Lemtrada at 12 mg per day for 5 days. She tolerated the infusion fairly well with no significant systemic side effects or allergic reactions.   We had previously discussed the risk of autoimmune disease with Lemtrada. She is post operative with thyroidectomy due to thyroid cancer.   A little tissue remains but is being suppressed.   She is on supplementation.   MRI  06/05/15 showed 3 small enhancing lesions and she received several days IV steroid at that time.   She hd her second course of Lemtrada in December 2016 x 3 days.   IMAGING MRI brain 09/21/2021 showed T2/FLAIR hyperintense foci in the hemispheres, brainstem and cerebellum in a pattern and configuration consistent with chronic demyelinating plaque associated with multiple sclerosis.  None of the foci enhanced or appear to be acute.  Compared to the MRI from 07/16/2020, there were no new lesions. 2.   Partially empty sella turcica, unchanged compared to the 07/16/2020 MRI.  \  MRI Brain 07/16/2020 showed T2/FLAIR hyperintense foci in the hemispheres, brainstem and cerebellum in a pattern and configuration consistent with chronic demyelinating plaque associated with multiple sclerosis.  None of the foci appear to be acute.  They do not enhance.  Compared to the MRI from 06/13/2019, there are no new lesions.      The pituitary gland is thinned within a large sella turcica consistent with a partially empty sella turcica.  This looks unchanged compared to the 2020 MRI.  This is most likely an incidental finding but could also be seen with elevated intracranial pressures.       There is a normal enhancement pattern and there are no acute findings.   MRI brain 06/13/2019 showed  multiple foci within the hemispheres, brainstem and cerebellum in a pattern and configuration consistent with chronic demyelinating plaque associated with multiple sclerosis.  Compared to the MRI dated 06/07/2018, there are no new lesions.  None of the foci enhances.    MRI brain 06/07/2018 showed Multiple T2/FLAIR hyperintense foci in the hemispheres, brainstem and spinal cord in a pattern and configuration consistent with chronic demyelinating plaque associated with multiple sclerosis.  None of the foci appears to be acute.  When compared to the MRI dated 06/29/2017, there is no interval change   MRI cervical spine 07/16/2020 showed Several T2  hyperintense foci within the spinal cord.  None of the foci appear to be acute and they do not enhance.  They were all present on the MRI from 2005 and are consistent with chronic demyelinating plaque.       Mild multilevel degenerative changes as detailed above.  Although there is mild spinal stenosis at C3-C4 and C6-C7 and borderline spinal stenosis at C4-C5, there is no nerve root compression at any of the cervical levels.   Normal enhancement pattern.  MRI cervical spine 03/21/2004 shows multifocal cervical cord involvement by MS plaques, most prominent at the C6-7 level particularly towards the left. Foci are at C2-3, C4-5, C6-7 and T1-2 levels.  Interestingly at this same level the patient has a shallow broad based disc protrusion that effaces the ventral subarachnoid space.    MRI lumbar spine 10/29/2020 showed  Subtle T2 hyperintense focus within the spinal cord on sagittal STIR images adjacent to T11 could represent a chronic demyelinating plaque associated with her known multiple sclerosis.  At L3-L4, there are minimal to mild degenerative changes but no nerve root compression or spinal stenosis.   At L4-L5, there is moderate right greater than left facet hypertrophy with a large right joint effusion and mild ligamenta flava hypertrophy.  There is no nerve root compression or spinal stenosis.   At L5-S1, there is mild disc bulging and small joint effusions.  No nerve root compression or spinal stenosis.   REVIEW OF SYSTEMS:  Constitutional: No fevers, chills, sweats, or change in appetite.  She has fatigue and hypersomnia Eyes:  She notes double vision.   No eye pain Ear, nose and throat: No hearing loss, ear pain, nasal congestion, sore throat Cardiovascular: No chest pain, palpitations Respiratory:  No shortness of breath at rest or with exertion.   No wheezes GastrointestinaI: No nausea, vomiting, diarrhea, abdominal pain, fecal incontinence Genitourinary:  see above. Musculoskeletal:  No  neck pain, back pain Integumentary: No rash, pruritus, skin lesions Neurological: as above Psychiatric: No depression at this time (some in past)  No anxiety Endocrine: No palpitations, diaphoresis, change in appetite, change in weigh or increased thirst Hematologic/Lymphatic:  No anemia, purpura, petechiae. Allergic/Immunologic: No itchy/runny eyes, nasal congestion, recent allergic reactions, rashes  ALLERGIES: Allergies  Allergen Reactions   Penicillins Other (See Comments)    Causes yeast infections    HOME MEDICATIONS: Outpatient Medications Prior to Visit  Medication Sig Dispense Refill   dalfampridine 10 MG TB12 TAKE 1 TABLET EVERY 12 HOURS 180 tablet 3   dextroamphetamine (DEXEDRINE SPANSULE) 15 MG 24 hr capsule Take 3 capsules (45mg ) by mouth daily 90 capsule 0   omeprazole (PRILOSEC) 40 MG capsule Take 40 mg by mouth daily.   11   polyethylene glycol powder (GLYCOLAX/MIRALAX) 17 GM/SCOOP powder Take by mouth.     sertraline (ZOLOFT) 100 MG tablet TAKE 1 TABLET(100 MG) BY MOUTH DAILY 90 tablet 1   solifenacin (VESICARE) 10 MG tablet TAKE 1 TABLET(10 MG) BY MOUTH DAILY 30 tablet 11   TIROSINT 200 MCG CAPS Take 1 capsule by mouth daily.     Levothyroxine Sodium (TIROSINT) 88 MCG CAPS Take 2 capsules by mouth daily.     traMADol (ULTRAM) 50 MG tablet Take 1 tablet (50 mg total) by mouth every 6 (six) hours as needed. 90 tablet 0   Facility-Administered Medications Prior to Visit  Medication Dose Route Frequency Provider Last Rate Last Admin   gadopentetate dimeglumine (MAGNEVIST) injection 20 mL  20 mL Intravenous Once PRN Damont Balles, Nanine Means, MD        PAST MEDICAL HISTORY: Past Medical History:  Diagnosis Date   Anemia    Arthritis    Cancer ([place])    Common migraine 06/16/2017   Movement disorder    Multiple sclerosis ([place])    Thyroid cancer ([place])    2010   Thyroid disease    Vision abnormalities     PAST SURGICAL HISTORY: Past Surgical History:  Procedure  Laterality Date   CESAREAN SECTION     IUD REMOVAL N/A 12/27/2014   Procedure: INTRAUTERINE DEVICE (IUD) REMOVAL;  Surgeon: Princess Bruins, MD;  Location: [facility];  Service: Gynecology;  Laterality: N/A;  JOINT REPLACEMENT     KNEE SURGERY     LAPAROSCOPY N/A 12/27/2014   Procedure: LAPAROSCOPY DIAGNOSTIC ;  Surgeon: Princess Bruins, MD;  Location: [facility];  Service: Gynecology;  Laterality: N/A;   PATELLA RECONSTRUCTION     spinal ablation     THYROIDECTOMY      FAMILY HISTORY: Family History  Problem Relation Age of Onset   Breast cancer Maternal Grandmother 66   Cancer Maternal Grandfather        bone    Heart disease Paternal Grandmother    Heart disease Paternal Grandfather    Healthy Mother    Cancer Father        throat    Healthy Brother    Healthy Sister    Healthy Sister     SOCIAL HISTORY:  Social History   Socioeconomic History   Marital status: Married    Spouse name: Not on file   Number of children: Not on file   Years of education: Not on file   Highest education level: Not on file  Occupational History   Not on file  Tobacco Use   Smoking status: Former    Types: Cigarettes    Quit date: 10/02/2003    Years since quitting: 18.1   Smokeless tobacco: Never  Vaping Use   Vaping Use: Never used  Substance and Sexual Activity   Alcohol use: No    Alcohol/week: 0.0 standard drinks   Drug use: No   Sexual activity: Yes    Partners: Male    Birth control/protection: Surgical    Comment: 1st intercourse- 16, partners- 30, married- 78 yrs, BTL  Other Topics Concern   Not on file  Social History Narrative   Not on file   Social Determinants of Health   Financial Resource Strain: Not on file  Food Insecurity: Not on file  Transportation Needs: Not on file  Physical Activity: Not on file  Stress: Not on file  Social Connections: Not on file  Intimate Partner Violence: Not on file     PHYSICAL EXAM  Vitals:   11/12/21 1545  BP:  102/70  Pulse: 96  Weight: 268 lb 8 oz (121.8 kg)  Height: 5\' 1"  (1.549 m)    Body mass index is 50.73 kg/m.    General: The patient is an overweight woman in no acute distress  Neurologic Exam  Mental status: The patient is alert and oriented x 3 at the time of the examination. The patient has apparent normal recent and remote memory, with reduced attention span and concentration ability.   A couple times she had some word finding difficulty speech is normal.  Cranial nerves: There is mild disconjugate gaze looking for left..  Color vision is reduced OS.  Visual acuity was symmetric.    Facial strength is normal.  Hearing appears to be normal.  Motor:  Muscle bulk is normal.  She has increased muscle tone in the legs.  Strength is 5/5 in the arms and 4+/5 in the legs, left worse than right  Sensory: She has reduced vibration sensation in the right knee relative to the left.  She has reduced touch sensation in left leg relative to right.  Coordination: Cerebellar testing shows slightly reduced finger-nose-finger on the left.  She has mildly reduced heel -to-shin bilaterally  Gait and station: Station is slightly wide.  She has a poor gait that is wide with a right foot drop.  She is unable to do a tandem walk.  She has difficulty walking without her cane but can take steps inside the room.     Her Romberg is positive  Reflexes: Deep tendon reflexes are symmetric and brisk bilaterally.  No ankle clonus.    ASSESSMENT AND PLAN  Multiple sclerosis (HCC)  Lumbar facet joint syndrome  Chronic fatigue  Abnormality of gait  Dysesthesia  Attention deficit disorder, unspecified hyperactivity presence  Urinary frequency    1.  She completed 2 years of Lemtrada in 2015 in 2016 and has not had any exacerbation or new activity on MRI since.  She has completed the REMS.   We will check periodic imaging to assess for breakthrough activity.  MRI earlier this year showed no new  lesions. 2.     I believe she is disabled due to the combination of physical and cognitive impairment.  Cognition has slowly worsened over last few years.   She was let go from a position earlier this year due to inability to do the cognitive part of the job.  Physically gait and balance and coordination are problems     continue  Dexedrine for ADD/fatigue.  3.    Continue vesicare for the bladder spasms/frequency and tamsulosin for hesitancy.   4.   I will have her get formal neurocognitive testing with neuro psychologist to better document her cognitive impairment.   5.  Refer back for radiofrequency ablations of the medial branch nerves.  She had a benefit lasting many months last year.   She will return to see me in 6 months or sooner if she has new or worsening neurologic symptoms.   40-minute office visit with the majority of the time spent face-to-face for history and physical, discussion/counseling and decision-making.  Additional time with record review and documentation.    Morrigan Wickens A. Felecia Shelling, MD, PhD 04/17/2777, 2:42 PM Certified in Neurology, Clinical Neurophysiology, Sleep Medicine, Pain Medicine and Neuroimaging  [facility] [address] [place], [place] [address] [phone]

## 2021-11-13 ENCOUNTER — Other Ambulatory Visit: Payer: Self-pay | Admitting: Neurology

## 2021-11-13 DIAGNOSIS — M47816 Spondylosis without myelopathy or radiculopathy, lumbar region: Secondary | ICD-10-CM

## 2021-11-18 ENCOUNTER — Other Ambulatory Visit: Payer: Self-pay | Admitting: Neurology

## 2021-11-18 ENCOUNTER — Ambulatory Visit
Admission: RE | Admit: 2021-11-18 | Discharge: 2021-11-18 | Disposition: A | Discharge status: Home / Self Care | Payer: Managed Care, Other (non HMO) | Source: Ambulatory Visit | Attending: Neurology | Admitting: Neurology

## 2021-11-18 ENCOUNTER — Telehealth: Payer: Self-pay | Admitting: Neurology

## 2021-11-18 ENCOUNTER — Other Ambulatory Visit: Payer: Self-pay

## 2021-11-18 DIAGNOSIS — M47816 Spondylosis without myelopathy or radiculopathy, lumbar region: Secondary | ICD-10-CM

## 2021-11-18 NOTE — Discharge Instructions (Signed)

## 2021-11-18 NOTE — Telephone Encounter (Signed)
Sent to [facility] via [place]. Ph #  [phone] ?

## 2021-11-20 ENCOUNTER — Encounter: Payer: Self-pay | Admitting: Neurology

## 2021-11-26 ENCOUNTER — Other Ambulatory Visit: Payer: Self-pay

## 2021-11-26 ENCOUNTER — Ambulatory Visit
Admission: RE | Admit: 2021-11-26 | Discharge: 2021-11-26 | Disposition: A | Discharge status: Home / Self Care | Payer: Managed Care, Other (non HMO) | Source: Ambulatory Visit | Attending: Neurology | Admitting: Neurology

## 2021-11-26 ENCOUNTER — Other Ambulatory Visit: Payer: Self-pay | Admitting: Neurology

## 2021-11-26 ENCOUNTER — Encounter: Payer: Self-pay | Admitting: Neurology

## 2021-11-26 MED ORDER — KETOROLAC TROMETHAMINE 30 MG/ML IJ SOLN
30.0000 mg | Freq: Once | INTRAMUSCULAR | Status: AC
  Administered 2021-11-26: 30 mg via INTRAVENOUS

## 2021-11-26 MED ORDER — MIDAZOLAM HCL 2 MG/2ML IJ SOLN
1.0000 mg | INTRAMUSCULAR | Status: DC | PRN
  Administered 2021-11-26 (×4): 1 mg via INTRAVENOUS

## 2021-11-26 MED ORDER — SODIUM CHLORIDE 0.9 % IV SOLN: INTRAVENOUS | Status: DC

## 2021-11-26 MED ORDER — FENTANYL CITRATE PF 50 MCG/ML IJ SOSY
25.0000 ug | PREFILLED_SYRINGE | INTRAMUSCULAR | Status: DC | PRN
  Administered 2021-11-26 (×2): 50 ug via INTRAVENOUS

## 2021-11-26 NOTE — Progress Notes (Signed)
Pt back in nursing recovery area. Pt still drowsy from procedure but will wake up when spoken to. Pt follows commands, talks in complete sentences and has no complaints at this time. Pt will remain in nursing recovery area until discharged by radiologist.  ?

## 2021-11-26 NOTE — Discharge Instructions (Signed)
Radio Frequency Ablation Post Procedure Discharge Instructions ? ?May resume a regular diet and any medications that you routinely take (including pain medications). ?No driving day of procedure. ?Upon discharge go home and rest for at least 4 hours.  May use an ice pack as needed to injection sites on back. ?Remove bandades later, today. ? ? ? ?Please contact our office at [phone] for the following symptoms: ? ?Fever greater than 100 degrees ?Increased swelling, pain, or redness at injection site. ? ? ?Thank you for visiting [facility].  ?

## 2021-12-02 NOTE — Telephone Encounter (Signed)
I resent the referral to [facility] ph # [phone] & fax # [phone]. ?

## 2021-12-17 ENCOUNTER — Encounter: Payer: Self-pay | Admitting: Neurology

## 2022-01-05 ENCOUNTER — Other Ambulatory Visit: Payer: Self-pay | Admitting: Neurology

## 2022-01-05 ENCOUNTER — Other Ambulatory Visit (HOSPITAL_COMMUNITY): Payer: Self-pay

## 2022-01-05 ENCOUNTER — Encounter: Payer: Self-pay | Admitting: Neurology

## 2022-01-05 MED ORDER — DEXTROAMPHETAMINE SULFATE ER 15 MG PO CP24
ORAL_CAPSULE | ORAL | 0 refills | Status: DC
  Filled 2022-01-05: qty 90, 30d supply, fill #0

## 2022-01-06 ENCOUNTER — Other Ambulatory Visit (HOSPITAL_COMMUNITY): Payer: Self-pay

## 2022-01-06 DIAGNOSIS — Z0271 Encounter for disability determination: Secondary | ICD-10-CM

## 2022-01-06 MED ORDER — DEXTROAMPHETAMINE SULFATE ER 15 MG PO CP24
ORAL_CAPSULE | ORAL | 0 refills | Status: DC
  Filled 2022-01-06: qty 90, fill #0

## 2022-01-19 ENCOUNTER — Encounter: Payer: Self-pay | Admitting: Neurology

## 2022-01-21 ENCOUNTER — Other Ambulatory Visit: Payer: Self-pay | Admitting: Neurology

## 2022-01-21 DIAGNOSIS — G35 Multiple sclerosis: Secondary | ICD-10-CM

## 2022-01-25 ENCOUNTER — Telehealth: Payer: Self-pay | Admitting: Neurology

## 2022-01-25 NOTE — Telephone Encounter (Signed)
Sent to [facility] ph # [phone].  ?

## 2022-02-02 ENCOUNTER — Ambulatory Visit: Payer: Managed Care, Other (non HMO) | Admitting: Neurology

## 2022-02-15 ENCOUNTER — Encounter: Payer: Self-pay | Admitting: Neurology

## 2022-02-16 ENCOUNTER — Other Ambulatory Visit: Payer: Self-pay | Admitting: *Deleted

## 2022-02-16 MED ORDER — DEXTROAMPHETAMINE SULFATE ER 15 MG PO CP24: ORAL_CAPSULE | ORAL | 0 refills | Status: DC

## 2022-05-10 ENCOUNTER — Encounter: Payer: Self-pay | Admitting: Neurology

## 2022-05-24 ENCOUNTER — Encounter: Payer: Self-pay | Admitting: Neurology

## 2022-05-24 ENCOUNTER — Ambulatory Visit: Payer: Managed Care, Other (non HMO) | Admitting: Neurology

## 2022-05-24 VITALS — BP 140/85 | HR 90 | Ht 61.0 in | Wt 271.0 lb

## 2022-05-24 DIAGNOSIS — R208 Other disturbances of skin sensation: Secondary | ICD-10-CM

## 2022-05-24 DIAGNOSIS — G35 Multiple sclerosis: Secondary | ICD-10-CM | POA: Diagnosis not present

## 2022-05-24 DIAGNOSIS — M47816 Spondylosis without myelopathy or radiculopathy, lumbar region: Secondary | ICD-10-CM | POA: Diagnosis not present

## 2022-05-24 DIAGNOSIS — G3184 Mild cognitive impairment, so stated: Secondary | ICD-10-CM | POA: Diagnosis not present

## 2022-05-24 DIAGNOSIS — F09 Unspecified mental disorder due to known physiological condition: Secondary | ICD-10-CM

## 2022-05-24 DIAGNOSIS — R5383 Other fatigue: Secondary | ICD-10-CM

## 2022-05-24 DIAGNOSIS — F988 Other specified behavioral and emotional disorders with onset usually occurring in childhood and adolescence: Secondary | ICD-10-CM

## 2022-05-24 DIAGNOSIS — R269 Unspecified abnormalities of gait and mobility: Secondary | ICD-10-CM

## 2022-05-24 MED ORDER — ETODOLAC 400 MG PO TABS: 400.0000 mg | ORAL_TABLET | Freq: Two times a day (BID) | ORAL | 5 refills | Status: DC

## 2022-05-24 NOTE — Progress Notes (Signed)
[facility]  PATIENT: Allison Guzman DOB: [birth year]  REFERRING CLINICIAN:  Heather Spry HISTORY FROM:  patient  REASON FOR VISIT:  Multiple sclerosis.   HISTORICAL  CHIEF COMPLAINT:  Chief Complaint  Patient presents with   Follow-up    RM 2, alone. Disabilty was denied and she will appeal this decision.     HISTORY OF PRESENT ILLNESS:  Allison Guzman is a 51 year old woman with relapsing remitting MS.  Update 05/24/2022 She is about the same as last visit.    She has difficulty with gait due to right greater than left leg weakness.  She also has right knee pain.    Balance is reduced.  Dalfampridine has helped some.  She does poorly on stairs and needs to hold the bannister.    Sensation is doing well.    She has urinary frequency, helped mildly by Detrol or solifenacin - we discussed referral to urology.    She reports reduced color vision on the left.  Visual acuity is normal.   She notes some diplopia if she looks far left while driving looking over shoulder.   She has had more difficulty with both cognition and fatigue   Dexedrine had helped but she does not take all as prescribed as she is jittery with the full dose.     She sleeps well most nights.  She notes some depression and more anxiety (after job change).   Sertraline is mildly better than Celexa.   Abilify caused weight gain and she sropped.        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    Unfortunately, her disability appeal was denied.  I am surprised that this occurred as she has significant physical and cognitive impairments.  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   She was let go due to cognitive concerns and was told she was not catching on with the necessary tasks of the job.     She was diagnosed with mild cognitive impairment and depression on neurocognitive testing June 2023.  Low back pain worsened again and she repeated the radiofrequency ablation.   Unfortunately, she did not get much of a benefit.  Current pain is worse on the left.     She had bilateral L3 and L4 Medial branch RFA  12/02/2021.  We discussed that if pain continues to worsen she would need to consider seeing a neurosurgeon for consideration of fusion  MS History:   She was diagnosed with relapsing remitting MS in 2004 after presenting with diplopia and vertigo. She had MRI/CSF c/w MS.   She had a five-day course of IV steroids.    Dr. Brett Fairy placed her on Rebif.  She had a couple of exacerbations while on Rebif with some effect on her gait.   She trandferred care to me.  She was switched to Copaxone plus estriol. Additionally, bwas tried for short while.  Unfortunately, she continued to have breakthrough exacerbations. Around 2008, she started Tysabri. She did fairly well on Tysabri with one definite exacerbation in 2012.    She was also JCV antibody positive. Therefore, she opted to switch to Gilenya.   However, she continued to have exacerbations and disease activity on MRI and she switched to Tecfidera.   For about one year she was on Tecfidera but had a lot of GI side effects with nausea. Additionally she had breakthrough disease. December 2015 she received Lemtrada at 12 mg per day for  5 days. She tolerated the infusion fairly well with no significant systemic side effects or allergic reactions.   We had previously discussed the risk of autoimmune disease with Lemtrada. She is post operative with thyroidectomy due to thyroid cancer.   A little tissue remains but is being suppressed.   She is on supplementation.   MRI 06/05/15 showed 3 small enhancing lesions and she received several days IV steroid at that time.   She hd her second course of Lemtrada in December 2016 x 3 days.   IMAGING MRI brain 09/21/2021 showed T2/FLAIR hyperintense foci in the hemispheres, brainstem and cerebellum in a pattern and configuration consistent with chronic demyelinating plaque associated with multiple  sclerosis.  None of the foci enhanced or appear to be acute.  Compared to the MRI from 07/16/2020, there were no new lesions. 2.   Partially empty sella turcica, unchanged compared to the 07/16/2020 MRI.  \  MRI Brain 07/16/2020 showed T2/FLAIR hyperintense foci in the hemispheres, brainstem and cerebellum in a pattern and configuration consistent with chronic demyelinating plaque associated with multiple sclerosis.  None of the foci appear to be acute.  They do not enhance.  Compared to the MRI from 06/13/2019, there are no new lesions.      The pituitary gland is thinned within a large sella turcica consistent with a partially empty sella turcica.  This looks unchanged compared to the 2020 MRI.  This is most likely an incidental finding but could also be seen with elevated intracranial pressures.       There is a normal enhancement pattern and there are no acute findings.   MRI brain 06/13/2019 showed  multiple foci within the hemispheres, brainstem and cerebellum in a pattern and configuration consistent with chronic demyelinating plaque associated with multiple sclerosis.  Compared to the MRI dated 06/07/2018, there are no new lesions.  None of the foci enhances.    MRI brain 06/07/2018 showed Multiple T2/FLAIR hyperintense foci in the hemispheres, brainstem and spinal cord in a pattern and configuration consistent with chronic demyelinating plaque associated with multiple sclerosis.  None of the foci appears to be acute.  When compared to the MRI dated 06/29/2017, there is no interval change   MRI cervical spine 07/16/2020 showed Several T2 hyperintense foci within the spinal cord.  None of the foci appear to be acute and they do not enhance.  They were all present on the MRI from 2005 and are consistent with chronic demyelinating plaque.       Mild multilevel degenerative changes as detailed above.  Although there is mild spinal stenosis at C3-C4 and C6-C7 and borderline spinal stenosis at C4-C5, there is  no nerve root compression at any of the cervical levels.   Normal enhancement pattern.  MRI cervical spine 03/21/2004 shows multifocal cervical cord involvement by MS plaques, most prominent at the C6-7 level particularly towards the left. Foci are at C2-3, C4-5, C6-7 and T1-2 levels.  Interestingly at this same level the patient has a shallow broad based disc protrusion that effaces the ventral subarachnoid space.    MRI lumbar spine 10/29/2020 showed  Subtle T2 hyperintense focus within the spinal cord on sagittal STIR images adjacent to T11 could represent a chronic demyelinating plaque associated with her known multiple sclerosis.  At L3-L4, there are minimal to mild degenerative changes but no nerve root compression or spinal stenosis.   At L4-L5, there is moderate right greater than left facet hypertrophy with a large right joint effusion  and mild ligamenta flava hypertrophy.  There is no nerve root compression or spinal stenosis.   At L5-S1, there is mild disc bulging and small joint effusions.  No nerve root compression or spinal stenosis.   REVIEW OF SYSTEMS:  Constitutional: No fevers, chills, sweats, or change in appetite.  She has fatigue and hypersomnia Eyes:  She notes double vision.   No eye pain Ear, nose and throat: No hearing loss, ear pain, nasal congestion, sore throat Cardiovascular: No chest pain, palpitations Respiratory:  No shortness of breath at rest or with exertion.   No wheezes GastrointestinaI: No nausea, vomiting, diarrhea, abdominal pain, fecal incontinence Genitourinary:  see above. Musculoskeletal:  No neck pain, back pain Integumentary: No rash, pruritus, skin lesions Neurological: as above Psychiatric: No depression at this time (some in past)  No anxiety Endocrine: No palpitations, diaphoresis, change in appetite, change in weigh or increased thirst Hematologic/Lymphatic:  No anemia, purpura, petechiae. Allergic/Immunologic: No itchy/runny eyes, nasal  congestion, recent allergic reactions, rashes  ALLERGIES: Allergies  Allergen Reactions   Naltrexone-Bupropion Hcl Er Swelling   Penicillins Other (See Comments)    Causes yeast infections    HOME MEDICATIONS: Outpatient Medications Prior to Visit  Medication Sig Dispense Refill   dalfampridine 10 MG TB12 TAKE 1 TABLET EVERY 12 HOURS 180 tablet 3   dextroamphetamine (DEXEDRINE SPANSULE) 15 MG 24 hr capsule Take 3 capsules by mouth daily 90 capsule 0   omeprazole (PRILOSEC) 40 MG capsule Take 40 mg by mouth daily.   11   polyethylene glycol powder (GLYCOLAX/MIRALAX) 17 GM/SCOOP powder Take by mouth.     sertraline (ZOLOFT) 100 MG tablet One po qd 90 tablet 3   solifenacin (VESICARE) 10 MG tablet TAKE 1 TABLET(10 MG) BY MOUTH DAILY 30 tablet 11   TIROSINT 200 MCG CAPS Take 1 capsule by mouth daily.     Facility-Administered Medications Prior to Visit  Medication Dose Route Frequency Provider Last Rate Last Admin   gadopentetate dimeglumine (MAGNEVIST) injection 20 mL  20 mL Intravenous Once PRN Abri Vacca, Nanine Means, MD        PAST MEDICAL HISTORY: Past Medical History:  Diagnosis Date   Anemia    Arthritis    Cancer ([place])    Common migraine 06/16/2017   Movement disorder    Multiple sclerosis ([place])    Thyroid cancer ([place])    2010   Thyroid disease    Vision abnormalities     PAST SURGICAL HISTORY: Past Surgical History:  Procedure Laterality Date   CESAREAN SECTION     IUD REMOVAL N/A 12/27/2014   Procedure: INTRAUTERINE DEVICE (IUD) REMOVAL;  Surgeon: Princess Bruins, MD;  Location: [facility];  Service: Gynecology;  Laterality: N/A;   JOINT REPLACEMENT     KNEE SURGERY     LAPAROSCOPY N/A 12/27/2014   Procedure: LAPAROSCOPY DIAGNOSTIC ;  Surgeon: Princess Bruins, MD;  Location: [facility];  Service: Gynecology;  Laterality: N/A;   PATELLA RECONSTRUCTION     spinal ablation     THYROIDECTOMY      FAMILY HISTORY: Family History  Problem Relation Age of Onset   Breast  cancer Maternal Grandmother 57   Cancer Maternal Grandfather        bone    Heart disease Paternal Grandmother    Heart disease Paternal Grandfather    Healthy Mother    Cancer Father        throat    Healthy Brother    Healthy Sister    Healthy  Sister     SOCIAL HISTORY:  Social History   Socioeconomic History   Marital status: Married    Spouse name: Not on file   Number of children: Not on file   Years of education: Not on file   Highest education level: Not on file  Occupational History   Not on file  Tobacco Use   Smoking status: Former    Types: Cigarettes    Quit date: 10/02/2003    Years since quitting: 18.6   Smokeless tobacco: Never  Vaping Use   Vaping Use: Never used  Substance and Sexual Activity   Alcohol use: No    Alcohol/week: 0.0 standard drinks of alcohol   Drug use: No   Sexual activity: Yes    Partners: Male    Birth control/protection: Surgical    Comment: 1st intercourse- 16, partners- 65, married- 23 yrs, BTL  Other Topics Concern   Not on file  Social History Narrative   Not on file   Social Determinants of Health   Financial Resource Strain: Not on file  Food Insecurity: Not on file  Transportation Needs: Not on file  Physical Activity: Not on file  Stress: Not on file  Social Connections: Not on file  Intimate Partner Violence: Not on file     PHYSICAL EXAM  Vitals:   05/24/22 0830  BP: (!) 140/85  Pulse: 90  Weight: 271 lb (122.9 kg)  Height: '5\' 1"'$  (1.549 m)    Body mass index is 51.21 kg/m.    General: The patient is an overweight woman in no acute distress  Neurologic Exam  Mental status: The patient is alert and oriented x 3 at the time of the examination. The patient has apparent normal recent and remote memory, with reduced attention span and concentration ability.   A couple times she had some word finding difficulty speech is normal.  Cranial nerves: There is mild disconjugate gaze looking for left..   Color vision is reduced OS.  Visual acuity was symmetric.    Facial strength is normal.  Hearing appears to be normal.  Motor:  Muscle bulk is normal.  She has increased muscle tone in the legs.  Strength is 5/5 in the arms and 4+/5 in the legs, left worse than right  Sensory: She has reduced vibration sensation in the right knee relative to the left.  She has reduced touch sensation in left leg relative to right.  Coordination: Cerebellar testing shows slightly reduced finger-nose-finger on the left.  She has mildly reduced heel -to-shin bilaterally  Gait and station: Station is slightly wide.  She has a poor gait that is wide with a right foot drop.  She is unable to do a tandem walk.  She has difficulty walking without her cane but can take steps inside the room.     The Romberg is positive.  Reflexes: Deep tendon reflexes are symmetric and brisk bilaterally.  No ankle clonus.    ASSESSMENT AND PLAN  Multiple sclerosis (HCC)  Cognitive deficit secondary to multiple sclerosis (HCC)  Mild cognitive impairment  Lumbar facet joint syndrome  Dysesthesia  Abnormality of gait  Attention deficit disorder, unspecified hyperactivity presence  Other fatigue    1.  She has an active form of secondary progressive MS.  She has not had any recent exacerbations but unfortunately has continued to have some progression of both physical and cognitive impairment.  She completed 2 years of Lemtrada in 2015 in 2016 and has not had  any exacerbation or new activity on MRI    MRI does show many chronic lesions and atrophy.   We discussed that patients with secondary progressive MS can continue to worsen despite no new lesions 2.     I am surprised she was denied disability for her active form of Secondary progressive MS.    She has multiple spinal cord and brain MS plaques.   I believe she is disabled due to the combination of physical and cognitive impairment.  Cognition has slowly worsened over last  few years as is typical with secondary progressive MS, even in the absence of new lesions on MRI.   She has significant fatigue.   She was let go from a position earlier this year due to inability to do the cognitive part of the job.  Physically gait and balance and coordination are problems     continue  Dexedrine for ADD/fatigue.  3.   Continue vesicare for the bladder spasms/frequency and tamsulosin for hesitancy.    Refer to urology 4.   As LBP did not improve with facet RFA, I'll add etodolac. 5.   She will return to see me in 6 months or sooner if she has new or worsening neurologic symptoms.   42-minute office visit with the majority of the time spent face-to-face for history and physical, discussion/counseling and decision-making.  Additional time with record review and documentation.   Lera Gaines A. Felecia Shelling, MD, PhD 4/49/7530, 0:51 AM Certified in Neurology, Clinical Neurophysiology, Sleep Medicine, Pain Medicine and Neuroimaging  [facility] [address], [place], [place] [address] [phone]

## 2022-05-25 ENCOUNTER — Encounter: Payer: Self-pay | Admitting: Neurology

## 2022-05-25 ENCOUNTER — Other Ambulatory Visit: Payer: Self-pay | Admitting: Neurology

## 2022-05-25 DIAGNOSIS — M47816 Spondylosis without myelopathy or radiculopathy, lumbar region: Secondary | ICD-10-CM

## 2022-05-26 ENCOUNTER — Telehealth: Payer: Self-pay | Admitting: Neurology

## 2022-05-26 NOTE — Telephone Encounter (Signed)
Referral sent to [facility]. Phone: [phone], Fax: [phone]

## 2022-06-01 NOTE — Telephone Encounter (Signed)
Pt said called [facility] to schedule appt, was told have been referred to a neurologist there. Pt wanted to know why because already have a neurologist.   I Deneise Lever) informed pt the referral is for a neurosurgeon. Pt said will call [facility] to speak with someone. I will also call the office for clarity. Contacted [facility], was told based on the information on the referral pt would need to see a neurologist because has facet syndrome.  Discussed with the nurse, instructed to ask the pt if she would like to be referred to another facility. Pt said have spoken with [name]), she is going to send to their referral to fix the issues; will call you back to let you know.

## 2022-07-01 ENCOUNTER — Encounter: Payer: Self-pay | Admitting: Neurology

## 2022-07-05 ENCOUNTER — Encounter: Payer: Self-pay | Admitting: Neurology

## 2022-07-27 ENCOUNTER — Encounter: Payer: Self-pay | Admitting: Neurology

## 2022-08-09 ENCOUNTER — Other Ambulatory Visit: Payer: Self-pay | Admitting: Neurology

## 2022-08-16 ENCOUNTER — Other Ambulatory Visit: Payer: Self-pay | Admitting: Neurology

## 2022-08-18 ENCOUNTER — Other Ambulatory Visit: Payer: Self-pay | Admitting: Neurology

## 2022-10-15 ENCOUNTER — Encounter: Payer: Self-pay | Admitting: Neurology

## 2022-10-24 ENCOUNTER — Encounter: Payer: Self-pay | Admitting: Neurology

## 2022-10-25 ENCOUNTER — Other Ambulatory Visit: Payer: Self-pay | Admitting: Neurology

## 2022-10-25 MED ORDER — NAPROXEN 500 MG PO TABS: 500.0000 mg | ORAL_TABLET | Freq: Two times a day (BID) | ORAL | 3 refills | Status: AC | PRN

## 2022-11-11 ENCOUNTER — Encounter: Payer: Self-pay | Admitting: Neurology

## 2022-11-11 NOTE — Telephone Encounter (Signed)
Can you help patient with this?

## 2022-11-12 ENCOUNTER — Other Ambulatory Visit: Payer: Self-pay | Admitting: Neurology

## 2022-11-15 ENCOUNTER — Other Ambulatory Visit: Payer: Self-pay | Admitting: Neurology

## 2022-11-15 MED ORDER — MIRABEGRON ER 50 MG PO TB24: 50.0000 mg | ORAL_TABLET | Freq: Every day | ORAL | 11 refills | Status: DC

## 2022-11-15 NOTE — Telephone Encounter (Signed)
Follow up scheduled on 11/23/22

## 2022-11-22 ENCOUNTER — Other Ambulatory Visit (HOSPITAL_COMMUNITY): Payer: Self-pay

## 2022-11-23 ENCOUNTER — Encounter: Payer: Self-pay | Admitting: Neurology

## 2022-11-23 ENCOUNTER — Telehealth: Payer: Self-pay | Admitting: Neurology

## 2022-11-23 ENCOUNTER — Ambulatory Visit: Payer: Managed Care, Other (non HMO) | Admitting: Neurology

## 2022-11-23 VITALS — BP 126/85 | HR 84 | Ht 61.0 in | Wt 278.0 lb

## 2022-11-23 DIAGNOSIS — R5383 Other fatigue: Secondary | ICD-10-CM

## 2022-11-23 DIAGNOSIS — M47816 Spondylosis without myelopathy or radiculopathy, lumbar region: Secondary | ICD-10-CM | POA: Diagnosis not present

## 2022-11-23 DIAGNOSIS — R269 Unspecified abnormalities of gait and mobility: Secondary | ICD-10-CM

## 2022-11-23 DIAGNOSIS — F09 Unspecified mental disorder due to known physiological condition: Secondary | ICD-10-CM

## 2022-11-23 DIAGNOSIS — G35 Multiple sclerosis: Secondary | ICD-10-CM | POA: Diagnosis not present

## 2022-11-23 DIAGNOSIS — R350 Frequency of micturition: Secondary | ICD-10-CM

## 2022-11-23 DIAGNOSIS — F988 Other specified behavioral and emotional disorders with onset usually occurring in childhood and adolescence: Secondary | ICD-10-CM | POA: Diagnosis not present

## 2022-11-23 MED ORDER — TROSPIUM CHLORIDE 20 MG PO TABS: 20.0000 mg | ORAL_TABLET | Freq: Two times a day (BID) | ORAL | 5 refills | Status: DC

## 2022-11-23 NOTE — Telephone Encounter (Signed)
[employer] sent to [facility] they obtain auth [id number] 

## 2022-11-23 NOTE — Progress Notes (Signed)
[facility]  PATIENT: Allison Guzman DOB: 1971-07-31  REFERRING CLINICIAN:  Heather Spry HISTORY FROM:  patient  REASON FOR VISIT:  Multiple sclerosis.   HISTORICAL  CHIEF COMPLAINT:  Chief Complaint  Patient presents with   Follow-up    Pt in room 10, here for MS follow up. Pt c/o worsen lower back pain, had PT still has back pain. States headaches are starting again, started last week. Muscles weakness in legs.pt is not taking mirabegron ER not covered by insurance and too expensive.    HISTORY OF PRESENT ILLNESS:  Allison Guzman is a 52 year old woman with relapsing remitting MS.  Update 11/23/2022 Her MS is mostly stable with no exacerbation.   However, she continues to have physical and cognitive impairments.   She did Lemtrada x 2 cycles.     She has difficulty with gait due to right greater than left leg weakness.    Balance is reduced.  Dalfampridine has helped some.  She does poorly on stairs and needs to hold the bannister.    Sensation is doing well.     Bladder function is also an issue.    She has urinary frequency, helped mildly by solifenacin - she will see urology in April.      She reports reduced color vision on the left.  Visual acuity is normal.   She notes some diplopia if she looks far left while driving looking over shoulder.   She has had more difficulty with both cognition and fatigue   Dexedrine had helped but she does not take all as prescribed as she is jittery with the full dose.     She sleeps well most nights.  She notes some depression and more anxiety (after job change).   Sertraline is mildly better than Celexa.   Abilify caused weight gain and she sropped.        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    We discussed weight loss (medical or bariatric surgery).   She reports her endocrinologist was concerned that glp-1 agents might upset her thyroid function more.     Unfortunately, her  disability appeal was denied and she had a hearing earlier this week again.  She has significant physical and cognitive impairments.  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   She was let go due to cognitive concerns and was told she was not catching on with the necessary tasks of the job.     She was diagnosed with mild cognitive impairment and depression on neurocognitive testing June 2023.      Low back pain worsened again and she repeated bilateral L3 and L4 Medial branch RFA  .  Unfortunately, she did not get much of a benefit.  Current pain is worse on the left.    Pain was only better x 1 weeks.   She saw Dr. Katherine Roan ([facility]) who did not recommend surgery..     MS History:   She was diagnosed with relapsing remitting MS in 2004 after presenting with diplopia and vertigo. She had MRI/CSF c/w MS.   She had a five-day course of IV steroids.    Dr. Brett Fairy placed her on Rebif.  She had a couple of exacerbations while on Rebif with some effect on her gait.   She trandferred care to me.  She was switched to Copaxone plus estriol. Additionally, bwas tried for short while.  Unfortunately, she continued to have breakthrough exacerbations. Around  2008, she started Tysabri. She did fairly well on Tysabri with one definite exacerbation in 2012.    She was also JCV antibody positive. Therefore, she opted to switch to [place].   However, she continued to have exacerbations and disease activity on MRI and she switched to Tecfidera.   For about one year she was on Tecfidera but had a lot of GI side effects with nausea. Additionally she had breakthrough disease. December 2015 she received Lemtrada at 12 mg per day for 5 days. She tolerated the infusion fairly well with no significant systemic side effects or allergic reactions.   We had previously discussed the risk of autoimmune disease with Lemtrada. She is post operative with thyroidectomy due to thyroid cancer.   A little tissue remains but is being  suppressed.   She is on supplementation.   MRI 06/05/15 showed 3 small enhancing lesions and she received several days IV steroid at that time.   She hd her second course of Lemtrada in December 2016 x 3 days.   IMAGING MRI brain 09/21/2021 showed T2/FLAIR hyperintense foci in the hemispheres, brainstem and cerebellum in a pattern and configuration consistent with chronic demyelinating plaque associated with multiple sclerosis.  None of the foci enhanced or appear to be acute.  Compared to the MRI from 07/16/2020, there were no new lesions.     Partially empty sella turcica, unchanged compared to the 07/16/2020 MRI.  \  MRI Brain 07/16/2020 showed T2/FLAIR hyperintense foci in the hemispheres, brainstem and cerebellum in a pattern and configuration consistent with chronic demyelinating plaque associated with multiple sclerosis.  None of the foci appear to be acute.  They do not enhance.  Compared to the MRI from 06/13/2019, there are no new lesions.      The pituitary gland is thinned within a large sella turcica consistent with a partially empty sella turcica.  This looks unchanged compared to the 2020 MRI.  This is most likely an incidental finding but could also be seen with elevated intracranial pressures.       There is a normal enhancement pattern and there are no acute findings.   MRI brain 06/13/2019 showed  multiple foci within the hemispheres, brainstem and cerebellum in a pattern and configuration consistent with chronic demyelinating plaque associated with multiple sclerosis.  Compared to the MRI dated 06/07/2018, there are no new lesions.  None of the foci enhances.    MRI brain 06/07/2018 showed Multiple T2/FLAIR hyperintense foci in the hemispheres, brainstem and spinal cord in a pattern and configuration consistent with chronic demyelinating plaque associated with multiple sclerosis.  None of the foci appears to be acute.  When compared to the MRI dated 06/29/2017, there is no interval  change   MRI cervical spine 07/16/2020 showed Several T2 hyperintense foci within the spinal cord.  None of the foci appear to be acute and they do not enhance.  They were all present on the MRI from 2005 and are consistent with chronic demyelinating plaque.       Mild multilevel degenerative changes as detailed above.  Although there is mild spinal stenosis at C3-C4 and C6-C7 and borderline spinal stenosis at C4-C5, there is no nerve root compression at any of the cervical levels.   Normal enhancement pattern.  MRI cervical spine 03/21/2004 shows multifocal cervical cord involvement by MS plaques, most prominent at the C6-7 level particularly towards the left. Foci are at C2-3, C4-5, C6-7 and T1-2 levels.  Interestingly at this same level the  patient has a shallow broad based disc protrusion that effaces the ventral subarachnoid space.    MRI lumbar spine 10/29/2020 showed  Subtle T2 hyperintense focus within the spinal cord on sagittal STIR images adjacent to T11 could represent a chronic demyelinating plaque associated with her known multiple sclerosis.  At L3-L4, there are minimal to mild degenerative changes but no nerve root compression or spinal stenosis.   At L4-L5, there is moderate right greater than left facet hypertrophy with a large right joint effusion and mild ligamenta flava hypertrophy.  There is no nerve root compression or spinal stenosis.   At L5-S1, there is mild disc bulging and small joint effusions.  No nerve root compression or spinal stenosis.   REVIEW OF SYSTEMS:  Constitutional: No fevers, chills, sweats, or change in appetite.  She has fatigue and hypersomnia Eyes:  She notes double vision.   No eye pain Ear, nose and throat: No hearing loss, ear pain, nasal congestion, sore throat Cardiovascular: No chest pain, palpitations Respiratory:  No shortness of breath at rest or with exertion.   No wheezes GastrointestinaI: No nausea, vomiting, diarrhea, abdominal pain, fecal  incontinence Genitourinary:  see above. Musculoskeletal:  No neck pain, back pain Integumentary: No rash, pruritus, skin lesions Neurological: as above Psychiatric: No depression at this time (some in past)  No anxiety Endocrine: No palpitations, diaphoresis, change in appetite, change in weigh or increased thirst Hematologic/Lymphatic:  No anemia, purpura, petechiae. Allergic/Immunologic: No itchy/runny eyes, nasal congestion, recent allergic reactions, rashes  ALLERGIES: Allergies  Allergen Reactions   Naltrexone-Bupropion Hcl Er Swelling   Penicillins Other (See Comments)    Causes yeast infections    HOME MEDICATIONS: Outpatient Medications Prior to Visit  Medication Sig Dispense Refill   dalfampridine 10 MG TB12 TAKE 1 TABLET EVERY 12 HOURS 180 tablet 3   dextroamphetamine (DEXEDRINE SPANSULE) 15 MG 24 hr capsule Take 3 capsules by mouth daily 90 capsule 0   naproxen (NAPROSYN) 500 MG tablet Take 1 tablet (500 mg total) by mouth every 12 (twelve) hours as needed. Stop the etodolac. 60 tablet 3   omeprazole (PRILOSEC) 40 MG capsule Take 40 mg by mouth daily.   11   polyethylene glycol powder (GLYCOLAX/MIRALAX) 17 GM/SCOOP powder Take by mouth.     sertraline (ZOLOFT) 100 MG tablet TAKE 1 TABLET BY MOUTH EVERY DAY 90 tablet 0   TIROSINT 200 MCG CAPS Take 1 capsule by mouth daily.     solifenacin (VESICARE) 10 MG tablet TAKE 1 TABLET(10 MG) BY MOUTH DAILY 30 tablet 11   mirabegron ER (MYRBETRIQ) 50 MG TB24 tablet Take 1 tablet (50 mg total) by mouth daily. (Patient not taking: Reported on 11/23/2022) 30 tablet 11   Facility-Administered Medications Prior to Visit  Medication Dose Route Frequency Provider Last Rate Last Admin   gadopentetate dimeglumine (MAGNEVIST) injection 20 mL  20 mL Intravenous Once PRN Britt Bottom, MD        PAST MEDICAL HISTORY: Past Medical History:  Diagnosis Date   Anemia    Arthritis    Cancer ([place])    Common migraine 06/16/2017   Movement  disorder    Multiple sclerosis ([place])    Thyroid cancer ([place])    2010   Thyroid disease    Vision abnormalities     PAST SURGICAL HISTORY: Past Surgical History:  Procedure Laterality Date   CESAREAN SECTION     IUD REMOVAL N/A 12/27/2014   Procedure: INTRAUTERINE DEVICE (IUD) REMOVAL;  Surgeon: Princess Bruins,  MD;  Location: [facility];  Service: Gynecology;  Laterality: N/A;   JOINT REPLACEMENT     KNEE SURGERY     LAPAROSCOPY N/A 12/27/2014   Procedure: LAPAROSCOPY DIAGNOSTIC ;  Surgeon: Princess Bruins, MD;  Location: [facility];  Service: Gynecology;  Laterality: N/A;   PATELLA RECONSTRUCTION     spinal ablation     THYROIDECTOMY      FAMILY HISTORY: Family History  Problem Relation Age of Onset   Breast cancer Maternal Grandmother 78   Cancer Maternal Grandfather        bone    Heart disease Paternal Grandmother    Heart disease Paternal Grandfather    Healthy Mother    Cancer Father        throat    Healthy Brother    Healthy Sister    Healthy Sister     SOCIAL HISTORY:  Social History   Socioeconomic History   Marital status: Married    Spouse name: Not on file   Number of children: Not on file   Years of education: Not on file   Highest education level: Not on file  Occupational History   Not on file  Tobacco Use   Smoking status: Former    Types: Cigarettes    Quit date: 10/02/2003    Years since quitting: 19.1   Smokeless tobacco: Never  Vaping Use   Vaping Use: Never used  Substance and Sexual Activity   Alcohol use: No    Alcohol/week: 0.0 standard drinks of alcohol   Drug use: No   Sexual activity: Yes    Partners: Male    Birth control/protection: Surgical    Comment: 1st intercourse- 16, partners- 2, married- 64 yrs, BTL  Other Topics Concern   Not on file  Social History Narrative   Not on file   Social Determinants of Health   Financial Resource Strain: Not on file  Food Insecurity: Not on file  Transportation Needs: Not on  file  Physical Activity: Not on file  Stress: Not on file  Social Connections: Not on file  Intimate Partner Violence: Not on file     PHYSICAL EXAM  Vitals:   11/23/22 0818  BP: 126/85  Pulse: 84  Weight: 278 lb (126.1 kg)  Height: '5\' 1"'$  (1.549 m)    Body mass index is 52.53 kg/m.    General: The patient is an overweight woman in no acute distress  Neurologic Exam  Mental status: The patient is alert and oriented x 3 at the time of the examination. The patient has apparent normal recent and remote memory, with reduced attention span and concentration ability.   A couple times she had some word finding difficulty    Cranial nerves: There is mild disconjugate gaze looking for left..  Color vision is reduced OS.  Visual acuity was symmetric.    Facial strength is normal.  Hearing appears to be normal.  Motor:  Muscle bulk is normal.  She has increased muscle tone in the legs.  Strength is 5/5 in the arms and 4+/5 in the legs, left worse than right  Sensory: She has reduced vibration sensation in the right knee relative to the left.  She has reduced touch sensation in left leg relative to right.  Coordination: Cerebellar testing shows slightly reduced finger-nose-finger on the left.  She has mildly reduced heel -to-shin bilaterally  Gait and station: Station is slightly wide.  She has a poor gait that is wide with a  right foot drop.   She is using a walker and can't take more than a step withot it    The Romberg is positive.  Reflexes: Deep tendon reflexes are symmetric and brisk bilaterally.  No ankle clonus.    ASSESSMENT AND PLAN  Multiple sclerosis (HCC)  Cognitive deficit secondary to multiple sclerosis (HCC)  Lumbar facet joint syndrome  Abnormality of gait  Attention deficit disorder, unspecified hyperactivity presence  Other fatigue  Urinary frequency    1.  She has an active form of secondary progressive MS.  She has not had any recent exacerbations  but unfortunately has continued to have some progression of both physical and cognitive impairment.  She completed 2 years of Lemtrada in 2015 in 2016 and has not had any exacerbation or new activity on MRI    We will repeat MRI of the brain to determine if any breakthrough activity.    If present, consider a DMT 2.    She has multiple spinal cord and brain MS plaques.   I believe she is disabled due to the combination of physical and cognitive impairment.  Cognition has slowly worsened over last few years as is typical with secondary progressive MS, even in the absence of new lesions on MRI.   She has significant fatigue.   She was let go from a position earlier this year due to inability to do the cognitive part of the job.  Physically gait and balance and coordination are problems     continue  Dexedrine for ADD/fatigue.  3.  Change Vesicare to [place] for the bladder spasms/frequency and tamsulosin for hesitancy.    Refer to urology.  Unfortunately, insurance did not cover Myrbetriq.  4.   Continue etodolac for DJD.  Try to lose weight.  She is considering bariatric surgery.    5.   She will return to see me in 6 months or sooner if she has new or worsening neurologic symptoms.   40-minute office visit with the majority of the time spent face-to-face for history and physical, discussion/counseling and decision-making.  Additional time with record review and documentation.   Vinia Jemmott A. Felecia Shelling, MD, PhD [id number], [id number] AM Certified in Neurology, Clinical Neurophysiology, Sleep Medicine, Pain Medicine and Neuroimaging  [facility] [address], [place], [place] [address] [phone]

## 2022-11-25 DIAGNOSIS — R29898 Other symptoms and signs involving the musculoskeletal system: Secondary | ICD-10-CM | POA: Insufficient documentation

## 2022-11-25 DIAGNOSIS — M25561 Pain in right knee: Secondary | ICD-10-CM | POA: Insufficient documentation

## 2022-11-30 ENCOUNTER — Encounter: Payer: Self-pay | Admitting: Neurology

## 2022-12-12 ENCOUNTER — Encounter: Payer: Self-pay | Admitting: Neurology

## 2022-12-13 ENCOUNTER — Other Ambulatory Visit: Payer: Self-pay | Admitting: Neurology

## 2022-12-13 MED ORDER — TOLTERODINE TARTRATE ER 4 MG PO CP24: ORAL_CAPSULE | ORAL | 3 refills | Status: DC

## 2022-12-14 ENCOUNTER — Encounter: Payer: Self-pay | Admitting: Neurology

## 2022-12-14 NOTE — Telephone Encounter (Signed)
[place] pt was scheduled for MRI brain tomorrow and it has been canceled.

## 2022-12-15 ENCOUNTER — Other Ambulatory Visit: Payer: Managed Care, Other (non HMO)

## 2023-01-18 ENCOUNTER — Encounter: Payer: Self-pay | Admitting: Neurology

## 2023-01-18 ENCOUNTER — Ambulatory Visit
Admission: RE | Admit: 2023-01-18 | Discharge: 2023-01-18 | Disposition: A | Discharge status: Home / Self Care | Payer: Managed Care, Other (non HMO) | Source: Ambulatory Visit | Attending: Neurology | Admitting: Neurology

## 2023-01-18 DIAGNOSIS — G35 Multiple sclerosis: Secondary | ICD-10-CM | POA: Diagnosis not present

## 2023-01-18 DIAGNOSIS — R269 Unspecified abnormalities of gait and mobility: Secondary | ICD-10-CM | POA: Diagnosis not present

## 2023-01-18 MED ORDER — GADOPICLENOL 0.5 MMOL/ML IV SOLN
10.0000 mL | Freq: Once | INTRAVENOUS | Status: AC | PRN
  Administered 2023-01-18: 10 mL via INTRAVENOUS

## 2023-02-16 ENCOUNTER — Other Ambulatory Visit: Payer: Self-pay

## 2023-02-16 ENCOUNTER — Other Ambulatory Visit: Payer: Self-pay | Admitting: Neurology

## 2023-02-18 ENCOUNTER — Ambulatory Visit: Payer: Managed Care, Other (non HMO) | Admitting: Obstetrics & Gynecology

## 2023-02-23 ENCOUNTER — Ambulatory Visit (INDEPENDENT_AMBULATORY_CARE_PROVIDER_SITE_OTHER): Payer: Managed Care, Other (non HMO) | Admitting: Radiology

## 2023-02-23 ENCOUNTER — Encounter: Payer: Self-pay | Admitting: Radiology

## 2023-02-23 ENCOUNTER — Encounter: Payer: Self-pay | Admitting: Neurology

## 2023-02-23 VITALS — BP 102/74 | Ht 61.25 in | Wt 274.0 lb

## 2023-02-23 DIAGNOSIS — N912 Amenorrhea, unspecified: Secondary | ICD-10-CM

## 2023-02-23 DIAGNOSIS — Z01419 Encounter for gynecological examination (general) (routine) without abnormal findings: Secondary | ICD-10-CM

## 2023-02-23 DIAGNOSIS — N951 Menopausal and female climacteric states: Secondary | ICD-10-CM | POA: Diagnosis not present

## 2023-02-23 DIAGNOSIS — Z1211 Encounter for screening for malignant neoplasm of colon: Secondary | ICD-10-CM

## 2023-02-23 NOTE — Progress Notes (Addendum)
   Allison Guzman [birth year] [id number]   History:  51 y.o. G2P2 presents for annual exam. C/o hot flashes, would like to check levels to see if she is menopausal. Has MS, managed by neuro. IUD placed 2019. Being treated by urology for OAB.  Gynecologic History No LMP recorded. (Menstrual status: IUD).   Contraception/Family planning: IUD Sexually active: yes Last Pap: 2022. Results were: normal Last mammogram: 11/19/22. Results were: normal  Obstetric History OB History  Gravida Para Term Preterm AB Living  2 2       2   SAB IAB Ectopic Multiple Live Births               # Outcome Date GA Lbr Len/2nd Weight Sex Delivery Anes PTL Lv  2 Para           1 Para              The following portions of the patient's history were reviewed and updated as appropriate: allergies, current medications, past family history, past medical history, past social history, past surgical history, and problem list.  Review of Systems Pertinent items noted in HPI and remainder of comprehensive ROS otherwise negative.   Past medical history, past surgical history, family history and social history were all reviewed and documented in the EPIC chart.   Exam:  Vitals:   02/23/23 0859  BP: 102/74  Weight: 274 lb (124.3 kg)  Height: 5' 1.25" (1.556 m)   Body mass index is 51.35 kg/m.  General appearance:  Normal Respiratory  Auscultation:  Clear without wheezing or rhonchi Cardiovascular  Auscultation:  Regular rate, without rubs, murmurs or gallops  Edema/varicosities:  Not grossly evident Abdominal  Soft,nontender, without masses, guarding or rebound.  Liver/spleen:  No organomegaly noted  Hernia:  None appreciated  Skin  Inspection:  Grossly normal Breasts: Examined lying and sitting.   Right: Without masses, retractions, nipple discharge or axillary adenopathy.   Left: Without masses, retractions, nipple discharge or axillary adenopathy. Genitourinary   Inguinal/mons:  Normal  without inguinal adenopathy  External genitalia:  Normal appearing vulva with no masses, tenderness, or lesions  BUS/Urethra/Skene's glands:  Normal without masses or exudate  Vagina:  Normal appearing with normal color and discharge, no lesions  Cervix:  Normal appearing without discharge or lesions  Uterus:  Normal in size, shape and contour.  Mobile, nontender  Adnexa/parametria:     Rt: Normal in size, without masses or tenderness.   Lt: Normal in size, without masses or tenderness.  Anus and perineum: Normal   Raynelle Fanning, CMA present for exam  Assessment/Plan:   1. Well woman exam with routine gynecological exam Pap due 2027 Mammo due 11/2023 Colonoscopy overdue Labs with PCP  2. Vasomotor symptoms due to menopause - [facility] - Estradiol  3. Amenorrhea - [facility]   4. Colon cancer screening - Ambulatory referral to Gastroenterology   Discussed SBE, colonoscopy and DEXA screening as directed/appropriate. Recommend of exercise weekly, including weight bearing exercise. Encouraged the use of seatbelts and sunscreen. Return in 1 year for annual or as needed.   Arlie Solomons B WHNP-BC 9:10 AM 02/23/2023

## 2023-02-24 ENCOUNTER — Other Ambulatory Visit: Payer: Self-pay | Admitting: Neurology

## 2023-02-24 LAB — FOLLICLE STIMULATING HORMONE: FSH: 82.8 m[IU]/mL

## 2023-02-24 LAB — ESTRADIOL: Estradiol: 39 pg/mL

## 2023-02-24 MED ORDER — METHYLPREDNISOLONE 4 MG PO TABS: ORAL_TABLET | ORAL | 0 refills | Status: AC

## 2023-02-27 NOTE — Progress Notes (Signed)
[facility]  PATIENT: Allison Guzman DOB: 05/12/1971  REFERRING CLINICIAN:  Heather Spry HISTORY FROM:  patient  REASON FOR VISIT:  Multiple sclerosis.   HISTORICAL  CHIEF COMPLAINT:  Chief Complaint  Patient presents with   Follow-up    Pt in room 10, here for MS follow up. Pt c/o worsen lower back pain, had PT still has back pain. States headaches are starting again, started last week. Muscles weakness in legs.pt is not taking mirabegron ER not covered by insurance and too expensive.    HISTORY OF PRESENT ILLNESS:  Allison Guzman is a 52 year old woman with relapsing remitting MS.  Update  03/03/2023 Currently, her main problem is back pain.  This is axial without radiation into the legs.  Pain is worse with walking.  MRI has shown lumbar facet hypertrophy, worse at L4-L5 but no spinal stenosis or nerve root compression.  She has had medial branch blocks and radiofrequency ablations without benefit.  She saw Dr. Amada Jupiter (neurosurgery) and was not felt to be a surgical candidate.  Continue therapy and medication was recommended.  She saw a pain management and was prescribed buprenorphine patches but opted not to take them.  NSAIDs have helped the pain a little bit.  Her MS is mostly stable with no exacerbation.    She did Lemtrada x 2 cycles.   MRIs have been stable.  She has difficulty with gait due to right greater than left leg weakness.    Balance is reduced.  Dalfampridine has helped some.  She does poorly on stairs and needs to hold the bannister.    Sensation is doing well.   She can take some steps without a cane and can walk about 100 feet with a cane and further with her walker.  Bladder urge incontinence is also an issue.    She only has had mild benefit from anticholinergics and insurance would not cover Myrbetriq.  She reports reduced color vision on the left.  Visual acuity is normal.   She notes some diplopia if she looks far left while driving  looking over shoulder.   She has had more difficulty with both cognition and fatigue   Dexedrine had helped but she does not take all as prescribed as she is jittery with the full dose.     She sleeps well most nights.  She notes some depression and more anxiety (after job change).   Sertraline is mildly better than Celexa.   Abilify caused weight gain and she sropped.        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    We discussed weight loss (medical or bariatric surgery).   She reports her endocrinologist was concerned that glp-1 agents might upset her thyroid function more.       MS History:   She was diagnosed with relapsing remitting MS in 2004 after presenting with diplopia and vertigo. She had MRI/CSF c/w MS.   She had a five-day course of IV steroids.    Dr. Vickey Huger placed her on Rebif.  She had a couple of exacerbations while on Rebif with some effect on her gait.   She trandferred care to me.  She was switched to Copaxone plus estriol. Additionally, bwas tried for short while.  Unfortunately, she continued to have breakthrough exacerbations. Around 2008, she started Tysabri. She did fairly well on Tysabri with one definite exacerbation in 2012.    She was also JCV antibody positive. Therefore,  she opted to switch to Gilenya.   However, she continued to have exacerbations and disease activity on MRI and she switched to Tecfidera.   For about one year she was on Tecfidera but had a lot of GI side effects with nausea. Additionally she had breakthrough disease. December 2015 she received Lemtrada at 12 mg per day for 5 days. She tolerated the infusion fairly well with no significant systemic side effects or allergic reactions.   We had previously discussed the risk of autoimmune disease with Lemtrada. She is post operative with thyroidectomy due to thyroid cancer.   A little tissue remains but is being suppressed.   She is on supplementation.   MRI 06/05/15 showed 3 small  enhancing lesions and she received several days IV steroid at that time.   She hd her second course of Lemtrada in December 2016 x 3 days.   IMAGING MRI of the brain 01/18/2023 showed T2/FLAIR hyperintense foci in the cerebellum, brainstem and cerebral hemispheres in a pattern consistent with chronic demyelinating plaque associated with multiple sclerosis.  None of the foci enhanced or appear to be acute.  Compared to the MRI from 09/21/2021, there were no new lesions.  Enlarged sella turcica.  This has been stable over multiple MRIs.  MRI brain 09/21/2021 showed T2/FLAIR hyperintense foci in the hemispheres, brainstem and cerebellum in a pattern and configuration consistent with chronic demyelinating plaque associated with multiple sclerosis.  None of the foci enhanced or appear to be acute.  Compared to the MRI from 07/16/2020, there were no new lesions.     Partially empty sella turcica, unchanged compared to the 07/16/2020 MRI.    MRI Brain 07/16/2020 showed T2/FLAIR hyperintense foci in the hemispheres, brainstem and cerebellum in a pattern and configuration consistent with chronic demyelinating plaque associated with multiple sclerosis.  None of the foci appear to be acute.  They do not enhance.  Compared to the MRI from 06/13/2019, there are no new lesions.      The pituitary gland is thinned within a large sella turcica consistent with a partially empty sella turcica.  This looks unchanged compared to the 2020 MRI.  This is most likely an incidental finding but could also be seen with elevated intracranial pressures.       There is a normal enhancement pattern and there are no acute findings.   MRI brain 06/13/2019 showed  multiple foci within the hemispheres, brainstem and cerebellum in a pattern and configuration consistent with chronic demyelinating plaque associated with multiple sclerosis.  Compared to the MRI dated 06/07/2018, there are no new lesions.  None of the foci enhances.    MRI brain  06/07/2018 showed Multiple T2/FLAIR hyperintense foci in the hemispheres, brainstem and spinal cord in a pattern and configuration consistent with chronic demyelinating plaque associated with multiple sclerosis.  None of the foci appears to be acute.  When compared to the MRI dated 06/29/2017, there is no interval change   MRI cervical spine 07/16/2020 showed Several T2 hyperintense foci within the spinal cord.  None of the foci appear to be acute and they do not enhance.  They were all present on the MRI from 2005 and are consistent with chronic demyelinating plaque.       Mild multilevel degenerative changes as detailed above.  Although there is mild spinal stenosis at C3-C4 and C6-C7 and borderline spinal stenosis at C4-C5, there is no nerve root compression at any of the cervical levels.   Normal enhancement pattern.  MRI cervical spine 03/21/2004 shows multifocal cervical cord involvement by MS plaques, most prominent at the C6-7 level particularly towards the left. Foci are at C2-3, C4-5, C6-7 and T1-2 levels.  Interestingly at this same level the patient has a shallow broad based disc protrusion that effaces the ventral subarachnoid space.    MRI lumbar spine 10/29/2020 showed  Subtle T2 hyperintense focus within the spinal cord on sagittal STIR images adjacent to T11 could represent a chronic demyelinating plaque associated with her known multiple sclerosis.  At L3-L4, there are minimal to mild degenerative changes but no nerve root compression or spinal stenosis.   At L4-L5, there is moderate right greater than left facet hypertrophy with a large right joint effusion and mild ligamenta flava hypertrophy.  There is no nerve root compression or spinal stenosis.   At L5-S1, there is mild disc bulging and small joint effusions.  No nerve root compression or spinal stenosis.   REVIEW OF SYSTEMS:  Constitutional: No fevers, chills, sweats, or change in appetite.  She has fatigue and hypersomnia Eyes:  She  notes double vision.   No eye pain Ear, nose and throat: No hearing loss, ear pain, nasal congestion, sore throat Cardiovascular: No chest pain, palpitations Respiratory:  No shortness of breath at rest or with exertion.   No wheezes GastrointestinaI: No nausea, vomiting, diarrhea, abdominal pain, fecal incontinence Genitourinary:  see above. Musculoskeletal:  No neck pain, back pain Integumentary: No rash, pruritus, skin lesions Neurological: as above Psychiatric: No depression at this time (some in past)  No anxiety Endocrine: No palpitations, diaphoresis, change in appetite, change in weigh or increased thirst Hematologic/Lymphatic:  No anemia, purpura, petechiae. Allergic/Immunologic: No itchy/runny eyes, nasal congestion, recent allergic reactions, rashes  ALLERGIES: Allergies  Allergen Reactions   Naltrexone-Bupropion Hcl Er Swelling   Penicillins Other (See Comments)    Causes yeast infections    HOME MEDICATIONS: Outpatient Medications Prior to Visit  Medication Sig Dispense Refill   dalfampridine 10 MG TB12 TAKE 1 TABLET EVERY 12 HOURS 180 tablet 3   dextroamphetamine (DEXEDRINE SPANSULE) 15 MG 24 hr capsule Take 3 capsules by mouth daily 90 capsule 0   naproxen (NAPROSYN) 500 MG tablet Take 1 tablet (500 mg total) by mouth every 12 (twelve) hours as needed. Stop the etodolac. 60 tablet 3   omeprazole (PRILOSEC) 40 MG capsule Take 40 mg by mouth daily.   11   polyethylene glycol powder (GLYCOLAX/MIRALAX) 17 GM/SCOOP powder Take by mouth.     sertraline (ZOLOFT) 100 MG tablet TAKE 1 TABLET BY MOUTH EVERY DAY 90 tablet 0   TIROSINT 200 MCG CAPS Take 1 capsule by mouth daily.     solifenacin (VESICARE) 10 MG tablet TAKE 1 TABLET(10 MG) BY MOUTH DAILY 30 tablet 11   mirabegron ER (MYRBETRIQ) 50 MG TB24 tablet Take 1 tablet (50 mg total) by mouth daily. (Patient not taking: Reported on 11/23/2022) 30 tablet 11   Facility-Administered Medications Prior to Visit  Medication Dose  Route Frequency Provider Last Rate Last Admin   gadopentetate dimeglumine (MAGNEVIST) injection 20 mL  20 mL Intravenous Once PRN Asa Lente, MD        PAST MEDICAL HISTORY: Past Medical History:  Diagnosis Date   Anemia    Arthritis    Cancer (HCC)    Common migraine 06/16/2017   Movement disorder    Multiple sclerosis (HCC)    Thyroid cancer (HCC)    2010   Thyroid disease    Vision  abnormalities     PAST SURGICAL HISTORY: Past Surgical History:  Procedure Laterality Date   CESAREAN SECTION     IUD REMOVAL N/A 12/27/2014   Procedure: INTRAUTERINE DEVICE (IUD) REMOVAL;  Surgeon: Genia Del, MD;  Location: [facility];  Service: Gynecology;  Laterality: N/A;   JOINT REPLACEMENT     KNEE SURGERY     LAPAROSCOPY N/A 12/27/2014   Procedure: LAPAROSCOPY DIAGNOSTIC ;  Surgeon: Genia Del, MD;  Location: [facility];  Service: Gynecology;  Laterality: N/A;   PATELLA RECONSTRUCTION     spinal ablation     THYROIDECTOMY      FAMILY HISTORY: Family History  Problem Relation Age of Onset   Breast cancer Maternal Grandmother 37   Cancer Maternal Grandfather        bone    Heart disease Paternal Grandmother    Heart disease Paternal Grandfather    Healthy Mother    Cancer Father        throat    Healthy Brother    Healthy Sister    Healthy Sister     SOCIAL HISTORY:  Social History   Socioeconomic History   Marital status: Married    Spouse name: Not on file   Number of children: Not on file   Years of education: Not on file   Highest education level: Not on file  Occupational History   Not on file  Tobacco Use   Smoking status: Former    Types: Cigarettes    Quit date: 10/02/2003    Years since quitting: 19.1   Smokeless tobacco: Never  Vaping Use   Vaping Use: Never used  Substance and Sexual Activity   Alcohol use: No    Alcohol/week: 0.0 standard drinks of alcohol   Drug use: No   Sexual activity: Yes    Partners: Male    Birth  control/protection: Surgical    Comment: 1st intercourse- 16, partners- 20, married- 24 yrs, BTL  Other Topics Concern   Not on file  Social History Narrative   Not on file   Social Determinants of Health   Financial Resource Strain: Not on file  Food Insecurity: Not on file  Transportation Needs: Not on file  Physical Activity: Not on file  Stress: Not on file  Social Connections: Not on file  Intimate Partner Violence: Not on file     PHYSICAL EXAM  Vitals:   11/23/22 0818  BP: 126/85  Pulse: 84  Weight: 278 lb (126.1 kg)  Height: 5\' 1"  (1.549 m)    Body mass index is 52.53 kg/m.    General: The patient is an overweight woman in no acute distress.  She is tender over the lower lumbar region, right greater than left  Neurologic Exam  Mental status: The patient is alert and oriented x 3 at the time of the examination. The patient has apparent normal recent and remote memory, with reduced attention span and concentration ability.   A couple times she had some word finding difficulty    Cranial nerves: There is mild disconjugate gaze looking for left..  Color vision is reduced OS.  Visual acuity was symmetric.    Facial strength is normal.  Hearing appears to be normal.  Motor:  Muscle bulk is normal.  She has increased muscle tone in the legs.  Strength is 5/5 in the arms and 4+/5 in the legs, left worse than right  Sensory: She has reduced vibration sensation in the right knee relative to the  left.  She has reduced touch sensation in left leg relative to right.  Coordination: Cerebellar testing shows slightly reduced finger-nose-finger on the left.  She has mildly reduced heel -to-shin bilaterally  Gait and station: Station is slightly wide.  She has a poor gait that is wide with a right foot drop.   Does better with her cane. The Romberg is positive.  Reflexes: Deep tendon reflexes are symmetric and brisk bilaterally.  No ankle clonus.    ASSESSMENT AND  PLAN  Multiple sclerosis (HCC)  Cognitive deficit secondary to multiple sclerosis (HCC)  Lumbar facet joint syndrome  Abnormality of gait  Attention deficit disorder, unspecified hyperactivity presence  Other fatigue  Urinary frequency   1.  She is experiencing more lumbar region pain, likely due to her facet hypertrophy maximal at L4-L5.  She has not felt to be a surgical candidate.  I will have her evaluated by an interventional pain management doctor.  She might benefit from another try of radiofrequency ablations (unfortunately she did not get much benefit from the initial series).  Alternatively other interventions may be a benefit.   2.  She has an active form of secondary progressive MS.  She has not had any recent exacerbations but unfortunately has continued to have some progression of both physical and cognitive impairment.  She completed 2 years of Lemtrada in 2015 in 2016 and has not had any exacerbation or new activity on MRI    she continues to be stable with her last MRI in May 2024 showing no new lesions.   3.    She has multiple spinal cord and brain MS plaques.   I believe she is disabled due to the combination of physical and cognitive impairment.  Cognition has slowly worsened over last few years as is typical with secondary progressive MS, even in the absence of new lesions on MRI.   She has significant fatigue.   She was let go from a position earlier this year due to inability to do the cognitive part of the job.  Physically gait and balance and coordination are problems     continue  Dexedrine for ADD/fatigue.  3.  Unfortunately, insurance did not cover Myrbetriq.  I am going to have her evaluated by urogynecology to determine if she might benefit from adequate treatment versus a procedure. 4.   Continue etodolac for DJD.  Try to lose weight.  She is considering bariatric surgery.    5.   She will return to see me in 6 months or sooner if she has new or worsening  neurologic symptoms.   40-minute office visit with the majority of the time spent face-to-face for history and physical, discussion/counseling and decision-making.  Additional time with record review and documentation.   Latarshia Jersey A. Epimenio Foot, MD, PhD 11/23/2022, 8:56 AM Certified in Neurology, Clinical Neurophysiology, Sleep Medicine, Pain Medicine and Neuroimaging  [facility] [address], [address] [place], [place] [address] [phone]

## 2023-03-03 ENCOUNTER — Telehealth: Payer: Self-pay | Admitting: Neurology

## 2023-03-03 ENCOUNTER — Encounter: Payer: Self-pay | Admitting: Neurology

## 2023-03-03 ENCOUNTER — Ambulatory Visit: Payer: Managed Care, Other (non HMO) | Admitting: Neurology

## 2023-03-03 VITALS — BP 117/79 | HR 68 | Ht 61.0 in | Wt 276.0 lb

## 2023-03-03 DIAGNOSIS — G3184 Mild cognitive impairment, so stated: Secondary | ICD-10-CM

## 2023-03-03 DIAGNOSIS — R269 Unspecified abnormalities of gait and mobility: Secondary | ICD-10-CM

## 2023-03-03 DIAGNOSIS — M47816 Spondylosis without myelopathy or radiculopathy, lumbar region: Secondary | ICD-10-CM

## 2023-03-03 DIAGNOSIS — F988 Other specified behavioral and emotional disorders with onset usually occurring in childhood and adolescence: Secondary | ICD-10-CM

## 2023-03-03 DIAGNOSIS — G35 Multiple sclerosis: Secondary | ICD-10-CM | POA: Diagnosis not present

## 2023-03-03 DIAGNOSIS — N3941 Urge incontinence: Secondary | ICD-10-CM

## 2023-03-03 NOTE — Telephone Encounter (Signed)
Referral faxed to [facility]: Phone [phone]  Fax: [phone] 

## 2023-03-03 NOTE — Telephone Encounter (Signed)
Referral faxed to [facility]: Phone: [phone]   Fax: [phone]

## 2023-03-18 ENCOUNTER — Encounter: Payer: Self-pay | Admitting: Neurology

## 2023-03-21 ENCOUNTER — Other Ambulatory Visit: Payer: Self-pay | Admitting: Neurology

## 2023-03-21 MED ORDER — FLUOXETINE HCL 20 MG PO CAPS: 20.0000 mg | ORAL_CAPSULE | Freq: Every day | ORAL | 3 refills | Status: DC

## 2023-05-10 ENCOUNTER — Encounter: Payer: Self-pay | Admitting: Neurology

## 2023-05-15 ENCOUNTER — Other Ambulatory Visit: Payer: Self-pay | Admitting: Neurology

## 2023-05-15 ENCOUNTER — Encounter: Payer: Self-pay | Admitting: Neurology

## 2023-05-17 ENCOUNTER — Ambulatory Visit: Payer: Managed Care, Other (non HMO) | Admitting: Neurology

## 2023-05-17 ENCOUNTER — Other Ambulatory Visit: Payer: Self-pay | Admitting: Neurology

## 2023-05-17 MED ORDER — BUPROPION HCL ER (XL) 300 MG PO TB24
300.0000 mg | ORAL_TABLET | Freq: Every day | ORAL | 1 refills | Status: DC
Start: 2023-05-17 — End: 2023-07-04

## 2023-05-17 NOTE — Telephone Encounter (Signed)
See 05/15/23 my chart message  Rx denied

## 2023-05-18 ENCOUNTER — Encounter: Payer: Self-pay | Admitting: Neurology

## 2023-05-26 ENCOUNTER — Ambulatory Visit: Payer: Managed Care, Other (non HMO) | Admitting: Neurology

## 2023-06-18 ENCOUNTER — Encounter: Payer: Self-pay | Admitting: Neurology

## 2023-06-20 ENCOUNTER — Encounter: Payer: Self-pay | Admitting: Neurology

## 2023-06-20 ENCOUNTER — Other Ambulatory Visit: Payer: Self-pay | Admitting: Neurology

## 2023-07-04 ENCOUNTER — Ambulatory Visit: Payer: Managed Care, Other (non HMO) | Admitting: Neurology

## 2023-07-04 ENCOUNTER — Encounter: Payer: Self-pay | Admitting: Neurology

## 2023-07-04 VITALS — BP 133/91 | HR 106

## 2023-07-04 DIAGNOSIS — R269 Unspecified abnormalities of gait and mobility: Secondary | ICD-10-CM

## 2023-07-04 DIAGNOSIS — M47816 Spondylosis without myelopathy or radiculopathy, lumbar region: Secondary | ICD-10-CM | POA: Diagnosis not present

## 2023-07-04 DIAGNOSIS — G3184 Mild cognitive impairment, so stated: Secondary | ICD-10-CM | POA: Diagnosis not present

## 2023-07-04 DIAGNOSIS — N3941 Urge incontinence: Secondary | ICD-10-CM

## 2023-07-04 DIAGNOSIS — R350 Frequency of micturition: Secondary | ICD-10-CM

## 2023-07-04 DIAGNOSIS — G35 Multiple sclerosis: Secondary | ICD-10-CM | POA: Diagnosis not present

## 2023-07-04 MED ORDER — DEXTROAMPHETAMINE SULFATE ER 15 MG PO CP24: ORAL_CAPSULE | ORAL | 0 refills | Status: DC

## 2023-07-04 MED ORDER — ESCITALOPRAM OXALATE 10 MG PO TABS: 10.0000 mg | ORAL_TABLET | Freq: Every day | ORAL | 3 refills | Status: DC

## 2023-07-04 MED ORDER — DALFAMPRIDINE ER 10 MG PO TB12: ORAL_TABLET | ORAL | 3 refills | Status: AC

## 2023-07-04 NOTE — Progress Notes (Signed)
[facility]  PATIENT: Allison Guzman DOB: 1970/12/25  REFERRING CLINICIAN:  Heather Spry HISTORY FROM:  patient  REASON FOR VISIT:  Multiple sclerosis.   HISTORICAL  CHIEF COMPLAINT:  Chief Complaint  Patient presents with   Room 11    Pt is here Alone. Pt states things have been battling Vertigo since her last appointment. Pt states that she feels like she has a stick in her eye.      HISTORY OF PRESENT ILLNESS:  Allison Guzman is a 52 year old woman with relapsing remitting MS.  Update 07/04/2023 She is noting more issues with her cognition.   She scored 24/30 on the MoCA today (mild cognitive impairment).     She used to take dextroamphetamine 30 mg daily for MS related ADD and fatigue but stopped a year ago.    She also noted weight was better controlled on Dexedrine.   Due to thyroid cancer history, she can't do the GLP-1      07/04/2023    1:18 PM  Montreal Cognitive Assessment   Visuospatial/ Executive (0/5) 5  Naming (0/3) 3  Attention: Read list of digits (0/2) 2  Attention: Read list of letters (0/1) 1  Attention: Serial 7 subtraction starting at 100 (0/3) 1  Language: Repeat phrase (0/2) 1  Language : Fluency (0/1) 1  Abstraction (0/2) 2  Delayed Recall (0/5) 2  Orientation (0/6) 6  Total 24  Adjusted Score (based on education) 24   Delayed recall at 20 min was 1/5 without a prompt, 3/5 with category prompts.     Her MS is mostly stable with no exacerbation.    She did Lemtrada x 2 cycles.   MRIs have been stable.  She has difficulty with gait due to right greater than left leg weakness.    Balance is reduced.  Dalfampridine has helped some.  She does poorly on stairs and needs to hold the bannister.    Sensation is doing well.   She can take some steps without a cane and can walk about 100 feet with a cane.  With walker can go 400-500 feet  Bladder urge incontinence is also an issue.    She only has had mild benefit from  anticholinergics and insurance would not cover Myrbetriq.  She reports reduced color vision on the left.  Visual acuity is normal.   She notes some diplopia if she looks far left while driving looking over shoulder.   She has had more difficulty with both cognition and fatigue   Dexedrine had helped but she does not take all as prescribed as she is jittery with the full dose.     She sleeps well most nights.  She notes some depression and more anxiety (after job change).   Sertraline is mildly better than Celexa.   Abilify caused weight gain and she sropped.      She has axial back pain without radiation into the legs.  Pain is worse with walking.  MRI has shown lumbar facet hypertrophy, worse at L4-L5 but no spinal stenosis or nerve root compression.  She has had medial branch blocks and radiofrequency ablations without benefit.  She saw Dr. Amada Jupiter (neurosurgery) and was not felt to be a surgical candidate.  Continue therapy and medication was recommended.  She saw a pain management and was prescribed buprenorphine patches but opted not to take them.  NSAIDs have helped the pain a little bit.  We discussed weight loss (medical or bariatric surgery).  She reports her endocrinologist was concerned that glp-1 agents might upset her thyroid function more.       MS History:   She was diagnosed with relapsing remitting MS in 2004 after presenting with diplopia and vertigo. She had MRI/CSF c/w MS.   She had a five-day course of IV steroids.    Dr. Vickey Huger placed her on Rebif.  She had a couple of exacerbations while on Rebif with some effect on her gait.   She trandferred care to me.  She was switched to Copaxone plus estriol. Additionally, bwas tried for short while.  Unfortunately, she continued to have breakthrough exacerbations. Around 2008, she started Tysabri. She did fairly well on Tysabri with one definite exacerbation in 2012.    She was also JCV antibody positive. Therefore, she opted to switch  to Gilenya.   However, she continued to have exacerbations and disease activity on MRI and she switched to Tecfidera.   For about one year she was on Tecfidera but had a lot of GI side effects with nausea. Additionally she had breakthrough disease. December 2015 she received Lemtrada at 12 mg per day for 5 days. She tolerated the infusion fairly well with no significant systemic side effects or allergic reactions.   We had previously discussed the risk of autoimmune disease with Lemtrada. She is post operative with thyroidectomy due to thyroid cancer.   A little tissue remains but is being suppressed.   She is on supplementation.   MRI 06/05/15 showed 3 small enhancing lesions and she received several days IV steroid at that time.   She hd her second course of Lemtrada in December 2016 x 3 days.   IMAGING MRI of the brain 01/18/2023 showed T2/FLAIR hyperintense foci in the cerebellum, brainstem and cerebral hemispheres in a pattern consistent with chronic demyelinating plaque associated with multiple sclerosis.  None of the foci enhanced or appear to be acute.  Compared to the MRI from 09/21/2021, there were no new lesions.  Enlarged sella turcica.  This has been stable over multiple MRIs.  MRI brain 09/21/2021 showed T2/FLAIR hyperintense foci in the hemispheres, brainstem and cerebellum in a pattern and configuration consistent with chronic demyelinating plaque associated with multiple sclerosis.  None of the foci enhanced or appear to be acute.  Compared to the MRI from 07/16/2020, there were no new lesions.     Partially empty sella turcica, unchanged compared to the 07/16/2020 MRI.    MRI Brain 07/16/2020 showed T2/FLAIR hyperintense foci in the hemispheres, brainstem and cerebellum in a pattern and configuration consistent with chronic demyelinating plaque associated with multiple sclerosis.  None of the foci appear to be acute.  They do not enhance.  Compared to the MRI from 06/13/2019, there are no new lesions.       The pituitary gland is thinned within a large sella turcica consistent with a partially empty sella turcica.  This looks unchanged compared to the 2020 MRI.  This is most likely an incidental finding but could also be seen with elevated intracranial pressures.       There is a normal enhancement pattern and there are no acute findings.   MRI brain 06/13/2019 showed  multiple foci within the hemispheres, brainstem and cerebellum in a pattern and configuration consistent with chronic demyelinating plaque associated with multiple sclerosis.  Compared to the MRI dated 06/07/2018, there are no new lesions.  None of the foci enhances.    MRI brain 06/07/2018 showed Multiple T2/FLAIR hyperintense foci in  the hemispheres, brainstem and spinal cord in a pattern and configuration consistent with chronic demyelinating plaque associated with multiple sclerosis.  None of the foci appears to be acute.  When compared to the MRI dated 06/29/2017, there is no interval change   MRI cervical spine 07/16/2020 showed Several T2 hyperintense foci within the spinal cord.  None of the foci appear to be acute and they do not enhance.  They were all present on the MRI from 2005 and are consistent with chronic demyelinating plaque.       Mild multilevel degenerative changes as detailed above.  Although there is mild spinal stenosis at C3-C4 and C6-C7 and borderline spinal stenosis at C4-C5, there is no nerve root compression at any of the cervical levels.   Normal enhancement pattern.  MRI cervical spine 03/21/2004 shows multifocal cervical cord involvement by MS plaques, most prominent at the C6-7 level particularly towards the left. Foci are at C2-3, C4-5, C6-7 and T1-2 levels.  Interestingly at this same level the patient has a shallow broad based disc protrusion that effaces the ventral subarachnoid space.    MRI lumbar spine 10/29/2020 showed  Subtle T2 hyperintense focus within the spinal cord on sagittal STIR images adjacent  to T11 could represent a chronic demyelinating plaque associated with her known multiple sclerosis.  At L3-L4, there are minimal to mild degenerative changes but no nerve root compression or spinal stenosis.   At L4-L5, there is moderate right greater than left facet hypertrophy with a large right joint effusion and mild ligamenta flava hypertrophy.  There is no nerve root compression or spinal stenosis.   At L5-S1, there is mild disc bulging and small joint effusions.  No nerve root compression or spinal stenosis.   REVIEW OF SYSTEMS:  Constitutional: No fevers, chills, sweats, or change in appetite.  She has fatigue and hypersomnia Eyes:  She notes double vision.   No eye pain Ear, nose and throat: No hearing loss, ear pain, nasal congestion, sore throat Cardiovascular: No chest pain, palpitations Respiratory:  No shortness of breath at rest or with exertion.   No wheezes GastrointestinaI: No nausea, vomiting, diarrhea, abdominal pain, fecal incontinence Genitourinary:  see above. Musculoskeletal:  No neck pain, back pain Integumentary: No rash, pruritus, skin lesions Neurological: as above Psychiatric: No depression at this time (some in past)  No anxiety Endocrine: No palpitations, diaphoresis, change in appetite, change in weigh or increased thirst Hematologic/Lymphatic:  No anemia, purpura, petechiae. Allergic/Immunologic: No itchy/runny eyes, nasal congestion, recent allergic reactions, rashes  ALLERGIES: Allergies  Allergen Reactions   Naltrexone-Bupropion Hcl Er Swelling   Penicillins Other (See Comments)    Causes yeast infections    HOME MEDICATIONS: Outpatient Medications Prior to Visit  Medication Sig Dispense Refill   Alemtuzumab (LEMTRADA) 12 MG/1.2ML SOLN Inject into the vein.     levonorgestrel (MIRENA) 20 MCG/DAY IUD 1 each by Intrauterine route once. Inserted 06/22/18     methylPREDNISolone (MEDROL) 4 MG tablet Taper from 6 pills po for one day to 1 pill po the last  day over 6 days (Patient not taking: Reported on 03/03/2023) 21 tablet 0   naproxen (NAPROSYN) 500 MG tablet Take 1 tablet (500 mg total) by mouth every 12 (twelve) hours as needed. Stop the etodolac. 60 tablet 3   omeprazole (PRILOSEC) 40 MG capsule Take 40 mg by mouth daily.   11   polyethylene glycol powder (GLYCOLAX/MIRALAX) 17 GM/SCOOP powder Take by mouth.     TIROSINT 200 MCG CAPS  Take 1 capsule by mouth daily. 224 mg daily     tolterodine (DETROL LA) 4 MG 24 hr capsule TAKE 1 CAPSULE(4 MG) BY MOUTH DAILY 90 capsule 3   buPROPion (WELLBUTRIN XL) 300 MG 24 hr tablet Take 1 tablet (300 mg total) by mouth daily. 90 tablet 1   dalfampridine 10 MG TB12 TAKE 1 TABLET EVERY 12 HOURS 180 tablet 3   dextroamphetamine (DEXEDRINE SPANSULE) 15 MG 24 hr capsule Take 3 capsules by mouth daily 90 capsule 0   FLUoxetine (PROZAC) 20 MG capsule Take 1 capsule (20 mg total) by mouth daily. 90 capsule 3   Facility-Administered Medications Prior to Visit  Medication Dose Route Frequency Provider Last Rate Last Admin   gadopentetate dimeglumine (MAGNEVIST) injection 20 mL  20 mL Intravenous Once PRN Pat Sires, Pearletha Furl, MD        PAST MEDICAL HISTORY: Past Medical History:  Diagnosis Date   Anemia    Arthritis    Cancer (HCC)    Common migraine 06/16/2017   Movement disorder    Multiple sclerosis (HCC)    Thyroid cancer (HCC)    2010   Thyroid disease    Vision abnormalities     PAST SURGICAL HISTORY: Past Surgical History:  Procedure Laterality Date   CESAREAN SECTION     IUD REMOVAL N/A 12/27/2014   Procedure: INTRAUTERINE DEVICE (IUD) REMOVAL;  Surgeon: Genia Del, MD;  Location: [facility];  Service: Gynecology;  Laterality: N/A;   JOINT REPLACEMENT     KNEE SURGERY     LAPAROSCOPY N/A 12/27/2014   Procedure: LAPAROSCOPY DIAGNOSTIC ;  Surgeon: Genia Del, MD;  Location: [facility];  Service: Gynecology;  Laterality: N/A;   PATELLA RECONSTRUCTION     spinal ablation     THYROIDECTOMY       FAMILY HISTORY: Family History  Problem Relation Age of Onset   Healthy Mother    Gout Mother    Cancer Father        throat    Healthy Sister    Healthy Sister    Healthy Brother    Breast cancer Maternal Grandmother 55   Cancer Maternal Grandfather        bone    Heart disease Paternal Grandmother    Heart disease Paternal Grandfather     SOCIAL HISTORY:  Social History   Socioeconomic History   Marital status: Married    Spouse name: Not on file   Number of children: Not on file   Years of education: Not on file   Highest education level: Not on file  Occupational History   Not on file  Tobacco Use   Smoking status: Former    Current packs/day: 0.00    Types: Cigarettes    Quit date: 10/02/2003    Years since quitting: 19.7   Smokeless tobacco: Never  Vaping Use   Vaping status: Never Used  Substance and Sexual Activity   Alcohol use: No    Alcohol/week: 0.0 standard drinks of alcohol   Drug use: No   Sexual activity: Yes    Partners: Male    Birth control/protection: Surgical    Comment: 1st intercourse- 16, partners- 20, married- 24 yrs, BTL  Other Topics Concern   Not on file  Social History Narrative   Not on file   Social Determinants of Health   Financial Resource Strain: Low Risk  (11/15/2022)   Received from [facility], [facility]   Overall Financial Resource Strain (CARDIA)  Difficulty of Paying Living Expenses: Not very hard  Food Insecurity: Low Risk  (03/17/2023)   Received from [facility]   Hunger Vital Sign    Worried About Running Out of Food in the Last Year: Never true    Ran Out of Food in the Last Year: Never true  Transportation Needs: Not on file (03/17/2023)  Physical Activity: Insufficiently Active (11/15/2022)   Received from [facility], [facility]   Exercise Vital Sign    Days of Exercise per Week: 2 days    Minutes of Exercise per Session: 10 min  Stress: Stress Concern Present (11/15/2022)   Received from  [facility], [facility] of Occupational Health - Occupational Stress Questionnaire    Feeling of Stress : To some extent  Social Connections: Moderately Integrated (11/15/2022)   Received from [facility], [facility]   Social Network    How would you rate your social network (family, work, friends)?: Adequate participation with social networks  Intimate Partner Violence: Not At Risk (11/15/2022)   Received from [facility], [facility]   HITS    Over the last 12 months how often did your partner physically hurt you?: 1    Over the last 12 months how often did your partner insult you or talk down to you?: 1    Over the last 12 months how often did your partner threaten you with physical harm?: 1    Over the last 12 months how often did your partner scream or curse at you?: 1     PHYSICAL EXAM  Vitals:   07/04/23 1315  BP: (!) 133/91  Pulse: (!) 106    There is no height or weight on file to calculate BMI.    General: The patient is an overweight woman in no acute distress.  She is tender over the lower lumbar region, right greater than left  Neurologic Exam  Mental status: The patient is alert and oriented x 3 at the time of the examination. The patient has apparent normal recent and remote memory, with reduced attention span and concentration ability.   A couple times she had some word finding difficulty    Cranial nerves: There is mild disconjugate gaze looking for left..  Color vision is reduced OS.  Visual acuity was symmetric.    Facial strength is normal.  Hearing appears to be normal.  Motor:  Muscle bulk is normal.  She has increased muscle tone in the legs.  Strength is 5/5 in the arms and 4+/5 in the legs, left worse than right  Sensory: She has reduced vibration sensation in the right knee relative to the left.  She has reduced touch sensation in left leg relative to right.  Coordination: Cerebellar testing shows slightly reduced  finger-nose-finger on the left.  She has mildly reduced heel -to-shin bilaterally  Gait and station: Station is slightly wide.  The gait is poor she has a right foot drop..   Does better with her walker. The Romberg is positive.  Reflexes: Deep tendon reflexes are symmetric and brisk bilaterally.  No ankle clonus.    ASSESSMENT AND PLAN  Multiple sclerosis (HCC)  Lumbar facet joint syndrome  Abnormality of gait  Mild cognitive impairment  Urge incontinence  Urinary frequency   1.  She has an active form of secondary progressive MS.  She has not had any recent exacerbations but unfortunately has continued to have some progression of both physical and cognitive impairment.  She completed 2 years of Lemtrada in 2015 in 2016 and has not had any exacerbation or new activity on MRI   the last MRI in May 2024 showing no new lesions.   2.   She continues to experience lumbar region pain, likely due to facet hypertrophy.    She has not felt to be a surgical candidate.  I will have her evaluated by an interventional pain management doctor.  Continue medications.  3.    She has multiple spinal cord and brain MS plaques.   I believe she is disabled due to the combination of physical and cognitive impairment.  Cognition has slowly worsened over last few years as is typical with secondary progressive MS, even in the absence of new lesions on MRI.   She has significant fatigue.   She was let go from a position earlier this year due to inability to do the cognitive part of the job.  Physically gait and balance and coordination are problems     continue  Dexedrine for ADD/fatigue.  4.  We will have her switch from Wellbutrin to Lexapro due to higher risk of seizure with Wellbutrin in combination with dalfampridine.  Continue dalfampridine. 5.   Renew Dexedrine.  This had helped her keep her weight under control and also help for the MS associated attention deficit and fatigue  6.   She will return to see  me in 6 months sooner if new or worsening neurologic symptoms.   Tyric Rodeheaver A. Epimenio Foot, MD, PhD 07/04/2023, 1:53 PM Certified in Neurology, Clinical Neurophysiology, Sleep Medicine, Pain Medicine and Neuroimaging  [facility] [address] [place], [place] [address] [phone]

## 2023-07-10 ENCOUNTER — Encounter: Payer: Self-pay | Admitting: Neurology

## 2023-07-11 ENCOUNTER — Other Ambulatory Visit: Payer: Self-pay

## 2023-07-11 DIAGNOSIS — R269 Unspecified abnormalities of gait and mobility: Secondary | ICD-10-CM

## 2023-07-13 ENCOUNTER — Telehealth: Payer: Self-pay | Admitting: Neurology

## 2023-07-13 NOTE — Telephone Encounter (Signed)
Referral sent to [facility], phone # [phone]. 

## 2023-08-05 DIAGNOSIS — E669 Obesity, unspecified: Secondary | ICD-10-CM | POA: Insufficient documentation

## 2023-09-19 ENCOUNTER — Encounter: Payer: Self-pay | Admitting: Neurology

## 2023-10-26 ENCOUNTER — Encounter: Payer: Self-pay | Admitting: Neurology

## 2023-10-26 ENCOUNTER — Other Ambulatory Visit: Payer: Self-pay | Admitting: Neurology

## 2023-10-26 DIAGNOSIS — R1310 Dysphagia, unspecified: Secondary | ICD-10-CM

## 2023-10-26 DIAGNOSIS — G35 Multiple sclerosis: Secondary | ICD-10-CM

## 2023-10-27 ENCOUNTER — Telehealth (HOSPITAL_COMMUNITY): Payer: Self-pay | Admitting: *Deleted

## 2023-10-27 NOTE — Telephone Encounter (Signed)
Attempted to contact patient to schedule OP MBS. Left VM @ [phone]. RKEEL

## 2023-10-28 ENCOUNTER — Other Ambulatory Visit (HOSPITAL_COMMUNITY): Payer: Self-pay | Admitting: Neurology

## 2023-10-28 DIAGNOSIS — R1310 Dysphagia, unspecified: Secondary | ICD-10-CM

## 2023-11-08 DIAGNOSIS — K5900 Constipation, unspecified: Secondary | ICD-10-CM | POA: Insufficient documentation

## 2023-11-08 DIAGNOSIS — R12 Heartburn: Secondary | ICD-10-CM | POA: Insufficient documentation

## 2023-11-08 DIAGNOSIS — N3281 Overactive bladder: Secondary | ICD-10-CM | POA: Insufficient documentation

## 2023-11-09 ENCOUNTER — Encounter (HOSPITAL_COMMUNITY): Payer: Managed Care, Other (non HMO)

## 2023-11-16 ENCOUNTER — Encounter (HOSPITAL_COMMUNITY): Payer: Managed Care, Other (non HMO)

## 2023-11-22 ENCOUNTER — Ambulatory Visit (HOSPITAL_COMMUNITY): Payer: Managed Care, Other (non HMO)

## 2023-11-22 ENCOUNTER — Encounter: Payer: Self-pay | Admitting: Neurology

## 2023-11-22 ENCOUNTER — Encounter (HOSPITAL_COMMUNITY): Payer: Self-pay

## 2023-11-22 ENCOUNTER — Ambulatory Visit (HOSPITAL_COMMUNITY)
Admission: RE | Admit: 2023-11-22 | Discharge: 2023-11-22 | Disposition: A | Discharge status: Home / Self Care | Payer: Managed Care, Other (non HMO) | Source: Ambulatory Visit | Attending: Family Medicine | Admitting: Family Medicine

## 2023-11-22 DIAGNOSIS — R350 Frequency of micturition: Secondary | ICD-10-CM

## 2023-11-22 DIAGNOSIS — N3941 Urge incontinence: Secondary | ICD-10-CM

## 2023-11-22 DIAGNOSIS — G35 Multiple sclerosis: Secondary | ICD-10-CM

## 2023-11-22 DIAGNOSIS — R1310 Dysphagia, unspecified: Secondary | ICD-10-CM

## 2023-11-23 NOTE — Telephone Encounter (Signed)
 neuro

## 2023-11-29 ENCOUNTER — Telehealth: Payer: Self-pay

## 2023-11-29 ENCOUNTER — Telehealth: Payer: Self-pay | Admitting: Neurology

## 2023-11-29 ENCOUNTER — Other Ambulatory Visit (HOSPITAL_COMMUNITY): Payer: Self-pay

## 2023-11-29 NOTE — Telephone Encounter (Signed)
 Pharmacy Patient Advocate Encounter   Received notification from Physician's Office that prior authorization for Dalfampridine ER 10MG  er tablets is required/requested.   Insurance verification completed.   The patient is insured through [employer] .   Per test claim: PA required; PA submitted to above mentioned insurance via CoverMyMeds Key/confirmation #/EOC [id number] Status is pending

## 2023-11-29 NOTE — Telephone Encounter (Signed)
 Called pt back. Relayed PA approved and should be able to process refill now. She verbalized understanding.

## 2023-11-29 NOTE — Telephone Encounter (Signed)
Called pt and notified her of approval.

## 2023-11-29 NOTE — Telephone Encounter (Signed)
 Pharmacy Patient Advocate Encounter  Received notification from [employer] that Prior Authorization for Dalfampridine ER 10MG  er tablets has been APPROVED from 11/29/2023 to 11/28/2024. Ran test claim, Copay is $25.00. This test claim was processed through [facility]- copay amounts may vary at other pharmacies due to pharmacy/plan contracts, or as the patient moves through the different stages of their insurance plan.   PA #/Case ID/Reference #: PA Case ID #: [id number]

## 2023-11-29 NOTE — Telephone Encounter (Signed)
 Pt called stating that she is needing a refill and she was informed by the pharmacy that she is needing a PA for the dalfampridine 10 MG TB12

## 2023-12-03 ENCOUNTER — Encounter: Payer: Self-pay | Admitting: Neurology

## 2023-12-05 MED ORDER — TOLTERODINE TARTRATE ER 4 MG PO CP24: ORAL_CAPSULE | ORAL | 0 refills | Status: DC

## 2023-12-05 NOTE — Telephone Encounter (Signed)
 Last seen on 07/07/23 Follow up scheduled on 01/16/24 Rx sent.

## 2023-12-06 ENCOUNTER — Other Ambulatory Visit: Payer: Self-pay | Admitting: Neurology

## 2023-12-27 ENCOUNTER — Telehealth (HOSPITAL_COMMUNITY): Payer: Self-pay | Admitting: Emergency Medicine

## 2023-12-27 NOTE — Telephone Encounter (Signed)
 Attempted to contact patient to reschedule her OP MBSS (swallow study). Left detailed VM with request for callback by Friday 4/18 or the order will be cancelled due to multiple reschedules and no return calls.   11/09/23 - transportation issues 11/16/23 - called us  to reschedule and switched hospital locations 11/22/23 - deferred 1 month 12/27/23 - left VM and request for call back to schedule by Friday April 18th   [name]

## 2024-01-16 ENCOUNTER — Ambulatory Visit: Payer: Managed Care, Other (non HMO) | Admitting: Neurology

## 2024-02-20 ENCOUNTER — Encounter: Payer: Self-pay | Admitting: Neurology

## 2024-02-20 ENCOUNTER — Ambulatory Visit: Admitting: Neurology

## 2024-02-20 VITALS — BP 126/68 | HR 93 | Ht 61.0 in | Wt 282.5 lb

## 2024-02-20 DIAGNOSIS — Z9889 Other specified postprocedural states: Secondary | ICD-10-CM | POA: Insufficient documentation

## 2024-02-20 DIAGNOSIS — R293 Abnormal posture: Secondary | ICD-10-CM | POA: Insufficient documentation

## 2024-02-20 DIAGNOSIS — M25661 Stiffness of right knee, not elsewhere classified: Secondary | ICD-10-CM | POA: Insufficient documentation

## 2024-02-20 DIAGNOSIS — N3941 Urge incontinence: Secondary | ICD-10-CM | POA: Diagnosis not present

## 2024-02-20 DIAGNOSIS — G35 Multiple sclerosis: Secondary | ICD-10-CM

## 2024-02-20 DIAGNOSIS — R269 Unspecified abnormalities of gait and mobility: Secondary | ICD-10-CM

## 2024-02-20 DIAGNOSIS — M47816 Spondylosis without myelopathy or radiculopathy, lumbar region: Secondary | ICD-10-CM

## 2024-02-20 DIAGNOSIS — R5383 Other fatigue: Secondary | ICD-10-CM

## 2024-02-20 DIAGNOSIS — F09 Unspecified mental disorder due to known physiological condition: Secondary | ICD-10-CM

## 2024-02-20 MED ORDER — MIRABEGRON ER 50 MG PO TB24: 50.0000 mg | ORAL_TABLET | Freq: Every day | ORAL | 11 refills | Status: AC

## 2024-02-20 MED ORDER — TOLTERODINE TARTRATE ER 4 MG PO CP24: ORAL_CAPSULE | ORAL | 3 refills | Status: AC

## 2024-02-20 MED ORDER — PHENTERMINE HCL 37.5 MG PO CAPS: 37.5000 mg | ORAL_CAPSULE | ORAL | 3 refills | Status: DC

## 2024-02-20 NOTE — Progress Notes (Signed)
 [facility]  PATIENT: Allison Guzman DOB: 03/24/1971  REFERRING CLINICIAN:  Heather Spry HISTORY FROM:  patient  REASON FOR VISIT:  Multiple sclerosis.   HISTORICAL  CHIEF COMPLAINT:  Chief Complaint  Patient presents with   RM10/MS    Pt is here with her Husband. Pt states that when Allison Guzman turn her head left Allison Guzman has double vision. Pt states that her walking hasn't been good, having trouble with balance. Pt states that Allison Guzman is dizzy most days when Allison Guzman gets up.     HISTORY OF PRESENT ILLNESS:  Allison Guzman is a 53 year old woman with relapsing remitting MS.  Update 02/20/2024 Allison Guzman feels her MS is mostly stable.  Allison Guzman did Lemtrada x 2 cycles.   MRIs have been stable.   Allison Guzman has not had any exacerbations though Allison Guzman does note that gait has progressed further and dizziness and diplopia to left gaze are sometimes worse.  Her last MRI in May 2024 showed no new lesions.  This was reviewed today.  Cognition is stable compared to last year but worse than baseline.  Allison Guzman used to take dextroamphetamine  30 mg daily for MS related ADD and fatigue but stopped in 2023.      Allison Guzman also noted weight was better controlled on Dexedrine  - helped but causes insomnia and constipation   Due to thyroid cancer history, Allison Guzman can't do the GLP-1    Allison Guzman has tried several diets unsuccessfully.         07/04/2023    1:18 PM  Montreal Cognitive Assessment   Visuospatial/ Executive (0/5) 5  Naming (0/3) 3  Attention: Read list of digits (0/2) 2  Attention: Read list of letters (0/1) 1  Attention: Serial 7 subtraction starting at 100 (0/3) 1  Language: Repeat phrase (0/2) 1  Language : Fluency (0/1) 1  Abstraction (0/2) 2  Delayed Recall (0/5) 2  Orientation (0/6) 6  Total 24  Adjusted Score (based on education) 24   Delayed recall at 20 min was 1/5 without a prompt, 3/5 with category prompts.    Her main impairment is reduced gait.  Allison Guzman has difficulty with gait due to right greater than left  leg weakness.    Balance is reduced.  Dalfampridine  has helped some.  Allison Guzman does poorly on stairs and needs to hold the bannister.    Sensation is doing well.   Allison Guzman can take some steps without a cane and is able to go 100 feet with a walker.  Without a walker Allison Guzman can only take a couple steps  Allison Guzman reports issues with bladder urge incontinence..    Allison Guzman only has had mild benefit from anticholinergics and insurance did not cover Myrbetriq  will be tried a couple years ago.  It is now a generic so we will try again.  Allison Guzman would do best on the combination.  Allison Guzman reports reduced color vision on the left.  Visual acuity is normal.   Allison Guzman notes some diplopia if Allison Guzman looks far left while driving looking over shoulder.   Allison Guzman sleeps well most nights.  Allison Guzman notes some depression and more anxiety (after job change).   Sertraline  is mildly better than Celexa .   Abilify  caused weight gain and Allison Guzman sropped.        LBP pain (axial) is unchanged. Pain is worse with walking.  MRI has shown lumbar facet hypertrophy, worse at L4-L5 but no spinal stenosis or nerve root compression.  Allison Guzman has had medial branch blocks and radiofrequency ablations without  prolonged benefit.  Allison Guzman saw Dr. Alecia Ames (neurosurgery) and was not felt to be a surgical candidate.  Continue therapy and medication was recommended.  Allison Guzman saw a pain management and was prescribed buprenorphine patches but opted not to take them.       MS History:   Allison Guzman was diagnosed with relapsing remitting MS in 2004 after presenting with diplopia and vertigo. Allison Guzman had MRI/CSF c/w MS.   Allison Guzman had a five-day course of IV steroids.    Dr. Albertina Hugger placed her on Rebif.  Allison Guzman had a couple of exacerbations while on Rebif with some effect on her gait.   Allison Guzman trandferred care to me.  Allison Guzman was switched to Copaxone plus estriol. Additionally, bwas tried for short while.  Unfortunately, Allison Guzman continued to have breakthrough exacerbations. Around 2008, Allison Guzman started Tysabri. Allison Guzman did fairly well on  Tysabri with one definite exacerbation in 2012.    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 Therefore, Allison Guzman opted to switch to Gilenya.   However, Allison Guzman continued to have exacerbations and disease activity on MRI and Allison Guzman switched to Tecfidera.   For about one year Allison Guzman was on Tecfidera but had a lot of GI side effects with nausea. Additionally Allison Guzman had breakthrough disease. December 2015 Allison Guzman received Lemtrada at 12 mg per day for 5 days. Allison Guzman tolerated the infusion fairly well with no significant systemic side effects or allergic reactions.   We had previously discussed the risk of autoimmune disease with Lemtrada. Allison Guzman is post operative with thyroidectomy due to thyroid cancer.   A little tissue remains but is being suppressed.   Allison Guzman is on supplementation.   MRI 06/05/15 showed 3 small enhancing lesions and Allison Guzman received several days IV steroid at that time.   Allison Guzman hd her second course of Lemtrada in December 2016 x 3 days.   IMAGING MRI of the brain 01/18/2023 showed T2/FLAIR hyperintense foci in the cerebellum, brainstem and cerebral hemispheres in a pattern consistent with chronic demyelinating plaque associated with multiple sclerosis.  None of the foci enhanced or appear to be acute.  Compared to the MRI from 09/21/2021, there were no new lesions.  Enlarged sella turcica.  This has been stable over multiple MRIs.  MRI brain 09/21/2021 showed T2/FLAIR hyperintense foci in the hemispheres, brainstem and cerebellum in a pattern and configuration consistent with chronic demyelinating plaque associated with multiple sclerosis.  None of the foci enhanced or appear to be acute.  Compared to the MRI from 07/16/2020, there were no new lesions.     Partially empty sella turcica, unchanged compared to the 07/16/2020 MRI.    MRI Brain 07/16/2020 showed T2/FLAIR hyperintense foci in the hemispheres, brainstem and cerebellum in a pattern and configuration consistent with chronic demyelinating plaque associated with multiple sclerosis.   None of the foci appear to be acute.  They do not enhance.  Compared to the MRI from 06/13/2019, there are no new lesions.      The pituitary gland is thinned within a large sella turcica consistent with a partially empty sella turcica.  This looks unchanged compared to the 2020 MRI.  This is most likely an incidental finding but could also be seen with elevated intracranial pressures.       There is a normal enhancement pattern and there are no acute findings.   MRI brain 06/13/2019 showed  multiple foci within the hemispheres, brainstem and cerebellum in a pattern and configuration consistent with chronic demyelinating plaque associated with multiple sclerosis.  Compared to the  MRI dated 06/07/2018, there are no new lesions.  None of the foci enhances.    MRI brain 06/07/2018 showed Multiple T2/FLAIR hyperintense foci in the hemispheres, brainstem and spinal cord in a pattern and configuration consistent with chronic demyelinating plaque associated with multiple sclerosis.  None of the foci appears to be acute.  When compared to the MRI dated 06/29/2017, there is no interval change   MRI cervical spine 07/16/2020 showed Several T2 hyperintense foci within the spinal cord.  None of the foci appear to be acute and they do not enhance.  They were all present on the MRI from 2005 and are consistent with chronic demyelinating plaque.       Mild multilevel degenerative changes as detailed above.  Although there is mild spinal stenosis at C3-C4 and C6-C7 and borderline spinal stenosis at C4-C5, there is no nerve root compression at any of the cervical levels.   Normal enhancement pattern.  MRI cervical spine 03/21/2004 shows multifocal cervical cord involvement by MS plaques, most prominent at the C6-7 level particularly towards the left. Foci are at C2-3, C4-5, C6-7 and T1-2 levels.  Interestingly at this same level the patient has a shallow broad based disc protrusion that effaces the ventral subarachnoid space.     MRI lumbar spine 10/29/2020 showed  Subtle T2 hyperintense focus within the spinal cord on sagittal STIR images adjacent to T11 could represent a chronic demyelinating plaque associated with her known multiple sclerosis.  At L3-L4, there are minimal to mild degenerative changes but no nerve root compression or spinal stenosis.   At L4-L5, there is moderate right greater than left facet hypertrophy with a large right joint effusion and mild ligamenta flava hypertrophy.  There is no nerve root compression or spinal stenosis.   At L5-S1, there is mild disc bulging and small joint effusions.  No nerve root compression or spinal stenosis.   REVIEW OF SYSTEMS:  Constitutional: No fevers, chills, sweats, or change in appetite.  Allison Guzman has fatigue and hypersomnia Eyes:  Allison Guzman notes double vision.   No eye pain Ear, nose and throat: No hearing loss, ear pain, nasal congestion, sore throat Cardiovascular: No chest pain, palpitations Respiratory:  No shortness of breath at rest or with exertion.   No wheezes GastrointestinaI: No nausea, vomiting, diarrhea, abdominal pain, fecal incontinence Genitourinary:  see above. Musculoskeletal:  No neck pain, back pain Integumentary: No rash, pruritus, skin lesions Neurological: as above Psychiatric: No depression at this time (some in past)  No anxiety Endocrine: No palpitations, diaphoresis, change in appetite, change in weigh or increased thirst Hematologic/Lymphatic:  No anemia, purpura, petechiae. Allergic/Immunologic: No itchy/runny eyes, nasal congestion, recent allergic reactions, rashes  ALLERGIES: Allergies  Allergen Reactions   Naltrexone-Bupropion  Hcl Er Swelling   Penicillins Other (See Comments)    Causes yeast infections    HOME MEDICATIONS: Outpatient Medications Prior to Visit  Medication Sig Dispense Refill   Alemtuzumab (LEMTRADA) 12 MG/1.2ML SOLN Inject into the vein.     Cholecalciferol 50 MCG (2000 UT) TABS Take 2,000 Units by mouth.      dalfampridine  10 MG TB12 TAKE 1 TABLET EVERY 12 HOURS 180 tablet 3   escitalopram  (LEXAPRO ) 10 MG tablet Take 1 tablet (10 mg total) by mouth daily. 90 tablet 3   levonorgestrel  (MIRENA ) 20 MCG/DAY IUD 1 each by Intrauterine route once. Inserted 06/22/18     methylPREDNISolone  (MEDROL ) 4 MG tablet Taper from 6 pills po for one day to 1 pill po the last day  over 6 days (Patient not taking: Reported on 02/20/2024) 21 tablet 0   omeprazole (PRILOSEC) 40 MG capsule Take 40 mg by mouth daily.   11   polyethylene glycol powder (GLYCOLAX/MIRALAX) 17 GM/SCOOP powder Take by mouth.     TIROSINT 200 MCG CAPS Take 1 capsule by mouth daily. 224 mg daily     tolterodine  (DETROL  LA) 4 MG 24 hr capsule TAKE 1 CAPSULE(4 MG) BY MOUTH DAILY 90 capsule 0   dextroamphetamine  (DEXEDRINE  SPANSULE) 15 MG 24 hr capsule Take 3 capsules by mouth daily (Patient not taking: Reported on 02/20/2024) 90 capsule 0   naproxen  (NAPROSYN ) 500 MG tablet Take 1 tablet (500 mg total) by mouth every 12 (twelve) hours as needed. Stop the etodolac . (Patient not taking: Reported on 02/20/2024) 60 tablet 3   Facility-Administered Medications Prior to Visit  Medication Dose Route Frequency Provider Last Rate Last Admin   gadopentetate dimeglumine  (MAGNEVIST ) injection 20 mL  20 mL Intravenous Once PRN Jorie Newness, MD        PAST MEDICAL HISTORY: Past Medical History:  Diagnosis Date   Anemia    Arthritis    Cancer (HCC)    Common migraine 06/16/2017   Movement disorder    Multiple sclerosis (HCC)    Thyroid cancer (HCC)    2010   Thyroid disease    Vision abnormalities     PAST SURGICAL HISTORY: Past Surgical History:  Procedure Laterality Date   CESAREAN SECTION     IUD REMOVAL N/A 12/27/2014   Procedure: INTRAUTERINE DEVICE (IUD) REMOVAL;  Surgeon: Marie-Lyne Lavoie, MD;  Location: [facility];  Service: Gynecology;  Laterality: N/A;   JOINT REPLACEMENT     KNEE SURGERY     LAPAROSCOPY N/A 12/27/2014   Procedure:  LAPAROSCOPY DIAGNOSTIC ;  Surgeon: Percy Bracken, MD;  Location: [facility];  Service: Gynecology;  Laterality: N/A;   PATELLA RECONSTRUCTION     spinal ablation     THYROIDECTOMY      FAMILY HISTORY: Family History  Problem Relation Age of Onset   Healthy Mother    Gout Mother    Cancer Father        throat    Healthy Sister    Healthy Sister    Healthy Brother    Breast cancer Maternal Grandmother 55   Cancer Maternal Grandfather        bone    Heart disease Paternal Grandmother    Heart disease Paternal Grandfather     SOCIAL HISTORY:  Social History   Socioeconomic History   Marital status: Married    Spouse name: Not on file   Number of children: Not on file   Years of education: Not on file   Highest education level: Not on file  Occupational History   Not on file  Tobacco Use   Smoking status: Former    Current packs/day: 0.00    Types: Cigarettes    Quit date: 10/02/2003    Years since quitting: 20.4   Smokeless tobacco: Never  Vaping Use   Vaping status: Never Used  Substance and Sexual Activity   Alcohol use: No    Alcohol/week: 0.0 standard drinks of alcohol   Drug use: No   Sexual activity: Yes    Partners: Male    Birth control/protection: Surgical    Comment: 1st intercourse- 16, partners- 20, married- 24 yrs, BTL  Other Topics Concern   Not on file  Social History Narrative   Not on file  Social Drivers of Corporate investment banker Strain: Low Risk  (11/15/2022)   Received from [facility]   Overall Financial Resource Strain (CARDIA)    Difficulty of Paying Living Expenses: Not very hard  Food Insecurity: Low Risk  (11/01/2023)   Received from [facility]   Hunger Vital Sign    Worried About Running Out of Food in the Last Year: Never true    Ran Out of Food in the Last Year: Never true  Transportation Needs: No Transportation Needs (11/01/2023)   Received from [facility]    In the past 12  months, has lack of reliable transportation kept you from medical appointments, meetings, work or from getting things needed for daily living? : No  Physical Activity: Insufficiently Active (11/15/2022)   Received from [facility], [facility]   Exercise Vital Sign    Days of Exercise per Week: 2 days    Minutes of Exercise per Session: 10 min  Stress: Stress Concern Present (11/15/2022)   Received from [facility], [facility] - Occupational Stress Questionnaire    Feeling of Stress : To some extent  Social Connections: Moderately Integrated (11/15/2022)   Received from [facility]   Social Network    How would you rate your social network (family, work, friends)?: Adequate participation with social networks  Intimate Partner Violence: Not At Risk (11/15/2022)   Received from [facility]   HITS    Over the last 12 months how often did your partner physically hurt you?: Never    Over the last 12 months how often did your partner insult you or talk down to you?: Never    Over the last 12 months how often did your partner threaten you with physical harm?: Never    Over the last 12 months how often did your partner scream or curse at you?: Never     PHYSICAL EXAM  Vitals:   02/20/24 0821  BP: 126/68  Pulse: 93  Weight: 282 lb 8 oz (128.1 kg)  Height: 5\' 1"  (1.549 m)    Body mass index is 53.38 kg/m.    General: The patient is an overweight woman in no acute distress.  Allison Guzman is tender over the lower lumbar region, right greater than left  Neurologic Exam  Mental status: The patient is alert and oriented x 3 at the time of the examination. The patient has apparent normal recent and remote memory, with reduced attention span and concentration ability.   A couple times Allison Guzman had some word finding difficulty    Cranial nerves: There is mild disconjugate gaze looking for left..  Color vision is desaturated  OS.  Visual acuity was symmetric.    Facial strength is normal.  Hearing appears to be normal.  Motor:  Muscle bulk is normal.  Allison Guzman has increased muscle tone in the legs.  Strength is 5/5 in the arms and 4+/5 in the legs, left worse than right  Sensory: Allison Guzman has reduced vibration sensation in the right knee relative to the left.  Allison Guzman has reduced touch sensation in left leg relative to right.  Coordination: Cerebellar testing shows slightly reduced finger-nose-finger on the left.  Allison Guzman has reduced heel -to-shin bilaterally  Gait and station: Station is slightly wide.  The gait is poor Allison Guzman has a right foot drop..  Allison Guzman needs walker to do more than a couple steps. . The Romberg  is positive.  Reflexes: Deep tendon reflexes are symmetric and brisk bilaterally.  No ankle clonus.    ASSESSMENT AND PLAN  Multiple sclerosis (HCC)  Urge incontinence  Abnormality of gait  Lumbar facet joint syndrome  Cognitive deficit secondary to multiple sclerosis (HCC)  Other fatigue   1.  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  No recent exacerbations or change on MRI though there has been continued progression of disability, especially gait..  We able to recheck an MRI of the brain to determine if there is any breakthrough activity.  If this is occurring we would need to restart a disease modifying therapy.  We also discussed tolebrutinib which is a BTK inhibitor that is currently at the FDA for the MS indication.  It was shown in clinical studies to have some benefit for secondary progressive MS. 2.   Allison Guzman continues to experience lumbar region pain, likely due to facet hypertrophy.    Allison Guzman has not felt to be a surgical candidate.  I will have her evaluated by an interventional pain management doctor.  Continue medications.   3.    Allison Guzman has multiple spinal cord and brain MS plaques.   I believe Allison Guzman is disabled due to the combination of physical and cognitive impairment.  Cognition has slowly worsened  over last few years as is typical with secondary progressive MS, even in the absence of new lesions on MRI.   Allison Guzman has significant fatigue.   Allison Guzman was let go from a position earlier this year due to inability to do the cognitive part of the job.  Physically gait and balance and coordination are neurologic issues 4.  Continue dalfampridine . 5.   Change to phentermine  from  Dexedrine .    6.   Add Myrbetriq  for urinary incontinence.  I believe Allison Guzman would be best on a combination of an anticholinergic (Detrol ) and Myrbetriq .  If not covered, we could increase the dose of Detrol  to 8 mg though for is the typical maximal.   Allison Guzman will return to see me in 6 months sooner if new or worsening neurologic symptoms.  40-minute office visit with the majority of the time spent face-to-face for history and physical, discussion/counseling and decision-making.  Additional time with record review and documentation.    Jillien Yakel A. Godwin Lat, MD, PhD 02/20/2024, 8:56 AM Certified in Neurology, Clinical Neurophysiology, Sleep Medicine, Pain Medicine and Neuroimaging  [facility] [address], [address] [place], [place] [address] [phone]

## 2024-02-20 NOTE — Addendum Note (Signed)
 Addended by: Hortensia Ma A on: 02/20/2024 10:37 AM   Modules accepted: Orders

## 2024-03-06 ENCOUNTER — Other Ambulatory Visit: Payer: Self-pay | Admitting: Neurology

## 2024-03-06 NOTE — Telephone Encounter (Signed)
 Last seen on 02/20/24 Follow up scheduled on 09/24/24    Dispensed Days Supply Quantity Provider Pharmacy  TOLTERODINE  TART ER 4MG  CAPSULES 02/20/2024 90 90 each Sater, Charlie LABOR, MD [facility]       Too soon to refill, Rx denied

## 2024-03-10 ENCOUNTER — Ambulatory Visit
Admission: RE | Admit: 2024-03-10 | Discharge: 2024-03-10 | Disposition: A | Discharge status: Home / Self Care | Source: Ambulatory Visit | Attending: Neurology | Admitting: Neurology

## 2024-03-10 DIAGNOSIS — R269 Unspecified abnormalities of gait and mobility: Secondary | ICD-10-CM | POA: Diagnosis not present

## 2024-03-10 DIAGNOSIS — G35 Multiple sclerosis: Secondary | ICD-10-CM

## 2024-03-10 MED ORDER — GADOPICLENOL 0.5 MMOL/ML IV SOLN
10.0000 mL | Freq: Once | INTRAVENOUS | Status: AC | PRN
  Administered 2024-03-10: 10 mL via INTRAVENOUS

## 2024-03-11 ENCOUNTER — Encounter: Payer: Self-pay | Admitting: Neurology

## 2024-03-11 DIAGNOSIS — Z79899 Other long term (current) drug therapy: Secondary | ICD-10-CM

## 2024-03-11 DIAGNOSIS — G35 Multiple sclerosis: Secondary | ICD-10-CM

## 2024-03-12 MED ORDER — TERIFLUNOMIDE 14 MG PO TABS: ORAL_TABLET | ORAL | 3 refills | Status: DC

## 2024-03-12 NOTE — Telephone Encounter (Signed)
 I spoke to Allison Guzman about the results of the MRI.  There did appear to be 1 punctate focus in the brain consistent with a small acute demyelinating plaque.  It was not present on previous MRI.  I think this likely represents mild breakthrough.  She was on Lemtrada and did 2 years with the last dose about 7 years ago.  We discussed options Aubagio (teriflunomide) 14 mg daily.  We discussed the liver monitoring required during the first 5 to 6 months 1 more week of Lemtrada And other medications such as an anti-CD20 agent.  I would hold off on Mavenclad because of her history of thyroid cancer  Of those options, I feel Aubagio would be her best choice as the breakthrough is very mild with just a single focus and Aubagio has good safety in regards to cancer risk and infection.  I will go ahead and send in a prescription for the standard dose.  I will also order monthly liver test for the next 5 to 6 months.

## 2024-03-19 ENCOUNTER — Encounter: Payer: Self-pay | Admitting: Neurology

## 2024-03-19 ENCOUNTER — Other Ambulatory Visit: Payer: Self-pay | Admitting: Neurology

## 2024-03-19 DIAGNOSIS — Z79899 Other long term (current) drug therapy: Secondary | ICD-10-CM

## 2024-03-19 DIAGNOSIS — G35 Multiple sclerosis: Secondary | ICD-10-CM

## 2024-03-19 NOTE — Telephone Encounter (Signed)
 Per note 02/20/24 She feels her MS is mostly stable.  She did Lemtrada x 2 cycles.

## 2024-03-21 ENCOUNTER — Encounter: Payer: Self-pay | Admitting: Neurology

## 2024-03-28 ENCOUNTER — Encounter: Payer: Self-pay | Admitting: Neurology

## 2024-03-28 ENCOUNTER — Other Ambulatory Visit: Payer: Self-pay | Admitting: Neurology

## 2024-03-28 MED ORDER — LISDEXAMFETAMINE DIMESYLATE 50 MG PO CAPS
50.0000 mg | ORAL_CAPSULE | Freq: Every day | ORAL | 0 refills | Status: DC
Start: 2024-03-28 — End: 2024-06-26

## 2024-04-07 ENCOUNTER — Encounter: Payer: Self-pay | Admitting: Neurology

## 2024-04-11 ENCOUNTER — Other Ambulatory Visit: Payer: Self-pay

## 2024-04-11 ENCOUNTER — Telehealth: Payer: Self-pay | Admitting: Pharmacy Technician

## 2024-04-11 ENCOUNTER — Other Ambulatory Visit (HOSPITAL_COMMUNITY): Payer: Self-pay

## 2024-04-11 ENCOUNTER — Telehealth: Payer: Self-pay | Admitting: Neurology

## 2024-04-11 MED ORDER — TERIFLUNOMIDE 14 MG PO TABS: ORAL_TABLET | ORAL | 3 refills | Status: AC

## 2024-04-11 NOTE — Telephone Encounter (Signed)
 Pharmacy Patient Advocate Encounter   Received notification from Pt Calls Messages that prior authorization for Teriflunomide  14 mg tablets is required/requested.   Insurance verification completed.   The patient is insured through [employer] .   Per test claim: Refill too soon. PA is not needed at this time. Medication was filled 04/11/2024. Next eligible fill date is 08/26/2024.

## 2024-04-11 NOTE — Telephone Encounter (Signed)
 Can someone please do PA for pt's Teriflunomide  14 mg tablets.

## 2024-04-11 NOTE — Telephone Encounter (Signed)
 PA request has been No PA Needed. New Encounter has been or will be created for follow up. For additional info see Pharmacy Prior Auth telephone encounter from 04/11/2024.

## 2024-04-11 NOTE — Telephone Encounter (Signed)
 Allison Guzman  from [facility] called to inform MD that Pt would like medication (Teriflunomide  14 MG TABS )to be sent to   [name], [place] - [address] Phone: [phone]  Fax: [phone]     [name] also requested for a new Rx  to be sent to [facility] as well . They will be fill Pt medication

## 2024-04-19 ENCOUNTER — Other Ambulatory Visit (INDEPENDENT_AMBULATORY_CARE_PROVIDER_SITE_OTHER): Payer: Self-pay

## 2024-04-19 DIAGNOSIS — Z0289 Encounter for other administrative examinations: Secondary | ICD-10-CM

## 2024-04-19 DIAGNOSIS — G35 Multiple sclerosis: Secondary | ICD-10-CM

## 2024-04-19 DIAGNOSIS — Z79899 Other long term (current) drug therapy: Secondary | ICD-10-CM

## 2024-04-20 ENCOUNTER — Ambulatory Visit: Payer: Self-pay | Admitting: Neurology

## 2024-04-20 LAB — HEPATIC FUNCTION PANEL
ALT: 21 IU/L (ref 0–32)
AST: 26 IU/L (ref 0–40)
Albumin: 3.8 g/dL — ABNORMAL LOW (ref 3.9–4.9)
Alkaline Phosphatase: 89 IU/L (ref 44–121)
Bilirubin Total: 0.4 mg/dL (ref 0.0–1.2)
Bilirubin, Direct: 0.14 mg/dL (ref 0.00–0.40)
Total Protein: 6.5 g/dL (ref 6.0–8.5)

## 2024-04-23 ENCOUNTER — Other Ambulatory Visit: Payer: Self-pay | Admitting: Neurology

## 2024-04-23 ENCOUNTER — Encounter: Payer: Self-pay | Admitting: Neurology

## 2024-04-23 DIAGNOSIS — E890 Postprocedural hypothyroidism: Secondary | ICD-10-CM

## 2024-04-23 DIAGNOSIS — C73 Malignant neoplasm of thyroid gland: Secondary | ICD-10-CM

## 2024-05-26 ENCOUNTER — Encounter: Payer: Self-pay | Admitting: Neurology

## 2024-06-01 ENCOUNTER — Other Ambulatory Visit: Payer: Self-pay

## 2024-06-01 ENCOUNTER — Other Ambulatory Visit (INDEPENDENT_AMBULATORY_CARE_PROVIDER_SITE_OTHER): Payer: Self-pay

## 2024-06-01 DIAGNOSIS — Z79899 Other long term (current) drug therapy: Secondary | ICD-10-CM

## 2024-06-01 DIAGNOSIS — Z0289 Encounter for other administrative examinations: Secondary | ICD-10-CM

## 2024-06-01 DIAGNOSIS — G35 Multiple sclerosis: Secondary | ICD-10-CM

## 2024-06-02 LAB — HEPATIC FUNCTION PANEL
ALT: 21 IU/L (ref 0–32)
AST: 19 IU/L (ref 0–40)
Albumin: 3.9 g/dL (ref 3.9–4.9)
Alkaline Phosphatase: 84 IU/L (ref 41–116)
Bilirubin Total: 0.4 mg/dL (ref 0.0–1.2)
Bilirubin, Direct: 0.15 mg/dL (ref 0.00–0.40)
Total Protein: 6.7 g/dL (ref 6.0–8.5)

## 2024-06-04 ENCOUNTER — Ambulatory Visit: Payer: Self-pay | Admitting: Neurology

## 2024-06-05 ENCOUNTER — Encounter: Payer: Self-pay | Admitting: *Deleted

## 2024-06-18 ENCOUNTER — Encounter: Payer: Self-pay | Admitting: Neurology

## 2024-06-19 ENCOUNTER — Encounter: Payer: Self-pay | Admitting: Neurology

## 2024-06-19 ENCOUNTER — Telehealth: Payer: Self-pay | Admitting: Neurology

## 2024-06-19 NOTE — Telephone Encounter (Signed)
 [facility]) make aware patient has allergy to milk and Teriflunomide  14 MG TABS contains lactose. Want to know how the neurologis t wants us  to proceed.

## 2024-06-20 NOTE — Telephone Encounter (Signed)
 Called [facility] back at [phone], option 1 (prescriber office) then option 2 (neurology), then option 2. Spoke w/ Deborah/pharm tech. Transferred me to pharmacist/Veronica.   Relayed that Dr. Vear ok for pt to continue medication. Relayed below info, she verbalized understanding and will get rx ready for pt. Nothing further needed.

## 2024-06-25 ENCOUNTER — Other Ambulatory Visit: Payer: Self-pay

## 2024-06-25 ENCOUNTER — Encounter: Payer: Self-pay | Admitting: Neurology

## 2024-06-25 ENCOUNTER — Other Ambulatory Visit: Payer: Self-pay | Admitting: Neurology

## 2024-06-25 MED ORDER — PHENTERMINE HCL 37.5 MG PO CAPS: 37.5000 mg | ORAL_CAPSULE | ORAL | 3 refills | Status: DC

## 2024-06-25 MED ORDER — PHENTERMINE HCL 37.5 MG PO CAPS: 37.5000 mg | ORAL_CAPSULE | ORAL | 5 refills | Status: DC

## 2024-06-26 NOTE — Telephone Encounter (Signed)
 Dr.Sater please deny Rx it was just refill on 06/25/24

## 2024-07-05 ENCOUNTER — Encounter: Payer: Self-pay | Admitting: Neurology

## 2024-07-06 NOTE — Telephone Encounter (Signed)
 Pt started teriflunomide  03/19/24. Needs once a month LFT's x6 months starting a month after being on medication.

## 2024-07-10 ENCOUNTER — Other Ambulatory Visit: Payer: Self-pay

## 2024-07-10 ENCOUNTER — Encounter: Payer: Self-pay | Admitting: Neurology

## 2024-07-10 DIAGNOSIS — G35D Multiple sclerosis, unspecified: Secondary | ICD-10-CM

## 2024-07-10 DIAGNOSIS — Z0289 Encounter for other administrative examinations: Secondary | ICD-10-CM

## 2024-07-10 DIAGNOSIS — Z79899 Other long term (current) drug therapy: Secondary | ICD-10-CM

## 2024-07-11 ENCOUNTER — Ambulatory Visit: Payer: Self-pay | Admitting: Neurology

## 2024-07-11 LAB — HEPATIC FUNCTION PANEL
ALT: 13 IU/L (ref 0–32)
AST: 15 IU/L (ref 0–40)
Albumin: 3.6 g/dL — ABNORMAL LOW (ref 3.8–4.9)
Alkaline Phosphatase: 88 IU/L (ref 49–135)
Bilirubin Total: 0.4 mg/dL (ref 0.0–1.2)
Bilirubin, Direct: 0.17 mg/dL (ref 0.00–0.40)
Total Protein: 6.3 g/dL (ref 6.0–8.5)

## 2024-07-18 ENCOUNTER — Other Ambulatory Visit: Payer: Self-pay | Admitting: Neurology

## 2024-07-19 ENCOUNTER — Encounter: Payer: Self-pay | Admitting: Neurology

## 2024-07-19 NOTE — Telephone Encounter (Signed)
 Pt has called to see if this medication Escitalopram  Oxalate 10 MG  will be filled by Dr Vear

## 2024-07-19 NOTE — Telephone Encounter (Signed)
 Dr. Vear- no mention in last note if you are continuing to refill/rx'ing? If you approve, please e-scribe below  Pt call to check on status of refill request. Phone room made her aware we are sending to MD to review first.

## 2024-07-20 ENCOUNTER — Encounter: Payer: Self-pay | Admitting: Neurology

## 2024-07-20 MED ORDER — ESCITALOPRAM OXALATE 10 MG PO TABS: 10.0000 mg | ORAL_TABLET | Freq: Every day | ORAL | 5 refills | Status: AC

## 2024-07-20 NOTE — Telephone Encounter (Signed)
 Last seen 02/20/24 Next appt 09/24/24 Dispenses   Dispensed Days Supply Quantity Provider Pharmacy  ESCITALOPRAM  10MG  TABLETS 05/11/2024 50 50 each Sater, Charlie LABOR, MD [facility] #...  ESCITALOPRAM  10MG  TABLETS 02/14/2024 90 90 each Sater, Charlie LABOR, MD [facility] DR[facility] STORE #...  ESCITALOPRAM  10MG  TABLETS 01/28/2024 30 10 each Sater, Charlie LABOR, MD [facility] DR[facility] STORE #...  ESCITALOPRAM  10MG  TABLETS 12/31/2023 30 30 each Sater, Charlie LABOR, MD [facility] DR[facility] STORE #...  ESCITALOPRAM  10MG  TABLETS 10/02/2023 90 90 each Sater, Charlie LABOR, MD [facility] DR[facility] STORE #.[place]

## 2024-09-24 ENCOUNTER — Encounter: Payer: Self-pay | Admitting: Neurology

## 2024-09-24 ENCOUNTER — Ambulatory Visit: Admitting: Neurology

## 2024-09-24 VITALS — BP 109/76 | HR 100 | Ht 61.0 in | Wt 269.5 lb

## 2024-09-24 DIAGNOSIS — G35C1 Active secondary progressive multiple sclerosis: Secondary | ICD-10-CM | POA: Diagnosis not present

## 2024-09-24 DIAGNOSIS — N3941 Urge incontinence: Secondary | ICD-10-CM

## 2024-09-24 DIAGNOSIS — M79604 Pain in right leg: Secondary | ICD-10-CM | POA: Diagnosis not present

## 2024-09-24 DIAGNOSIS — M79605 Pain in left leg: Secondary | ICD-10-CM

## 2024-09-24 DIAGNOSIS — R269 Unspecified abnormalities of gait and mobility: Secondary | ICD-10-CM

## 2024-09-24 DIAGNOSIS — R5383 Other fatigue: Secondary | ICD-10-CM | POA: Diagnosis not present

## 2024-09-24 DIAGNOSIS — C73 Malignant neoplasm of thyroid gland: Secondary | ICD-10-CM | POA: Diagnosis not present

## 2024-09-24 DIAGNOSIS — M7989 Other specified soft tissue disorders: Secondary | ICD-10-CM | POA: Diagnosis not present

## 2024-09-24 DIAGNOSIS — E7800 Pure hypercholesterolemia, unspecified: Secondary | ICD-10-CM | POA: Insufficient documentation

## 2024-09-24 DIAGNOSIS — E88819 Insulin resistance, unspecified: Secondary | ICD-10-CM | POA: Insufficient documentation

## 2024-09-24 DIAGNOSIS — Z79899 Other long term (current) drug therapy: Secondary | ICD-10-CM | POA: Diagnosis not present

## 2024-09-24 NOTE — Progress Notes (Signed)
 "  [facility]  PATIENT: Allison Guzman DOB: 10-06-1970  REFERRING CLINICIAN:  Heather Spry HISTORY FROM:  patient  REASON FOR VISIT:  Multiple sclerosis.   HISTORICAL  CHIEF COMPLAINT:  Chief Complaint  Patient presents with   RM10/MS    Pt is here with her Husband. Pt states she stopped her Teriflunomide  due diarrhea.    HISTORY OF PRESENT ILLNESS:  Allison Guzman is a 54 year old woman with relapsing remitting MS.  Update 02/20/2024 From  I spoke to Ms. Delaney about the results of the MRI.  There did appear to be 1 punctate focus in the brain consistent with a small acute demyelinating plaque.  It was not present on previous MRI.  I think this likely represents mild breakthrough.  She was on Lemtrada and did 2 years with the last dose about 7 years ago.  We discussed options Aubagio  (teriflunomide ) 14 mg daily.  We discussed the liver monitoring required during the first 5 to 6 months 1 more week of Lemtrada And other medications such as an anti-CD20 agent.  I would hold off on Mavenclad because of her history of thyroid cancer  She started Aubagio  but stopped due to diarrhea as a side effect.     She feels her MS is mostly stable.  She did Lemtrada x 2 cycles.   MRIs have been stable.   She has not had any exacerbations though she does note that gait has progressed further and dizziness and diplopia to left gaze are sometimes worse.  Her last MRI in May 2024 showed no new lesions.  This was reviewed today.  Cognition is stable compared to last year but worse than baseline.  She used to take dextroamphetamine  30 mg daily for MS related ADD and fatigue but stopped in 2023.      She also noted weight was better controlled on Dexedrine  - helped but causes insomnia and constipation   Due to thyroid cancer history, she can't do the GLP-1    She has tried several diets unsuccessfully.         07/04/2023    1:18 PM  Montreal Cognitive Assessment   Visuospatial/  Executive (0/5) 5  Naming (0/3) 3  Attention: Read list of digits (0/2) 2  Attention: Read list of letters (0/1) 1  Attention: Serial 7 subtraction starting at 100 (0/3) 1  Language: Repeat phrase (0/2) 1  Language : Fluency (0/1) 1  Abstraction (0/2) 2  Delayed Recall (0/5) 2  Orientation (0/6) 6  Total 24  Adjusted Score (based on education) 24   Delayed recall at 20 min was 1/5 without a prompt, 3/5 with category prompts.    Her main impairment is reduced gait.  She has difficulty with gait due to right greater than left leg weakness.    Balance is reduced.  Dalfampridine  has helped some.  She does poorly on stairs and needs to hold the bannister.    Sensation is doing well.   She can take some steps without a cane and is able to go 100 feet with a walker.  Without a walker she can only take a couple steps  She reports issues with bladder urge incontinence..    She only has had mild benefit from anticholinergics and insurance did not cover Myrbetriq  will be tried a couple years ago.  It is now a generic so we will try again.  She would do best on the combination.  She reports reduced color vision on the  left.  Visual acuity is normal.   She notes some diplopia if she looks far left while driving looking over shoulder.   She sleeps well most nights.  She notes some depression and more anxiety (after job change).   Sertraline  is mildly better than Celexa .   Abilify  caused weight gain and she sropped.        LBP pain (axial) is unchanged. Pain is worse with walking.  MRI has shown lumbar facet hypertrophy, worse at L4-L5 but no spinal stenosis or nerve root compression.  She has had medial branch blocks and radiofrequency ablations without prolonged benefit.  She saw Dr. Michaela (neurosurgery) and was not felt to be a surgical candidate.  Continue therapy and medication was recommended.  She saw a pain management and was prescribed buprenorphine patches but opted not to take them.        MS History:   She was diagnosed with relapsing remitting MS in 2004 after presenting with diplopia and vertigo. She had MRI/CSF c/w MS.   She had a five-day course of IV steroids.    Dr. Chalice placed her on Rebif.  She had a couple of exacerbations while on Rebif with some effect on her gait.   She trandferred care to me.  She was switched to Copaxone plus estriol. Additionally, bwas tried for short while.  Unfortunately, she continued to have breakthrough exacerbations. Around 2008, she started Tysabri. She did fairly well on Tysabri with one definite exacerbation in 2012.    She was also JCV antibody positive. Therefore, she opted to switch to Gilenya.   However, she continued to have exacerbations and disease activity on MRI and she switched to Tecfidera.   For about one year she was on Tecfidera but had a lot of GI side effects with nausea. Additionally she had breakthrough disease. December 2015 she received Lemtrada at 12 mg per day for 5 days. She tolerated the infusion fairly well with no significant systemic side effects or allergic reactions.   We had previously discussed the risk of autoimmune disease with Lemtrada. She is post operative with thyroidectomy due to thyroid cancer.   A little tissue remains but is being suppressed.   She is on supplementation.   MRI 06/05/15 showed 3 small enhancing lesions and she received several days IV steroid at that time.   She hd her second course of Lemtrada in December 2016 x 3 days.   IMAGING MRI of the brain 01/18/2023 showed T2/FLAIR hyperintense foci in the cerebellum, brainstem and cerebral hemispheres in a pattern consistent with chronic demyelinating plaque associated with multiple sclerosis.  None of the foci enhanced or appear to be acute.  Compared to the MRI from 09/21/2021, there were no new lesions.  Enlarged sella turcica.  This has been stable over multiple MRIs.  MRI brain 09/21/2021 showed T2/FLAIR hyperintense foci in the hemispheres,  brainstem and cerebellum in a pattern and configuration consistent with chronic demyelinating plaque associated with multiple sclerosis.  None of the foci enhanced or appear to be acute.  Compared to the MRI from 07/16/2020, there were no new lesions.     Partially empty sella turcica, unchanged compared to the 07/16/2020 MRI.    MRI Brain 07/16/2020 showed T2/FLAIR hyperintense foci in the hemispheres, brainstem and cerebellum in a pattern and configuration consistent with chronic demyelinating plaque associated with multiple sclerosis.  None of the foci appear to be acute.  They do not enhance.  Compared to the MRI from 06/13/2019, there  are no new lesions.      The pituitary gland is thinned within a large sella turcica consistent with a partially empty sella turcica.  This looks unchanged compared to the 2020 MRI.  This is most likely an incidental finding but could also be seen with elevated intracranial pressures.       There is a normal enhancement pattern and there are no acute findings.   MRI brain 06/13/2019 showed  multiple foci within the hemispheres, brainstem and cerebellum in a pattern and configuration consistent with chronic demyelinating plaque associated with multiple sclerosis.  Compared to the MRI dated 06/07/2018, there are no new lesions.  None of the foci enhances.    MRI brain 06/07/2018 showed Multiple T2/FLAIR hyperintense foci in the hemispheres, brainstem and spinal cord in a pattern and configuration consistent with chronic demyelinating plaque associated with multiple sclerosis.  None of the foci appears to be acute.  When compared to the MRI dated 06/29/2017, there is no interval change   MRI cervical spine 07/16/2020 showed Several T2 hyperintense foci within the spinal cord.  None of the foci appear to be acute and they do not enhance.  They were all present on the MRI from 2005 and are consistent with chronic demyelinating plaque.       Mild multilevel degenerative changes as  detailed above.  Although there is mild spinal stenosis at C3-C4 and C6-C7 and borderline spinal stenosis at C4-C5, there is no nerve root compression at any of the cervical levels.   Normal enhancement pattern.  MRI cervical spine 03/21/2004 shows multifocal cervical cord involvement by MS plaques, most prominent at the C6-7 level particularly towards the left. Foci are at C2-3, C4-5, C6-7 and T1-2 levels.  Interestingly at this same level the patient has a shallow broad based disc protrusion that effaces the ventral subarachnoid space.    MRI lumbar spine 10/29/2020 showed  Subtle T2 hyperintense focus within the spinal cord on sagittal STIR images adjacent to T11 could represent a chronic demyelinating plaque associated with her known multiple sclerosis.  At L3-L4, there are minimal to mild degenerative changes but no nerve root compression or spinal stenosis.   At L4-L5, there is moderate right greater than left facet hypertrophy with a large right joint effusion and mild ligamenta flava hypertrophy.  There is no nerve root compression or spinal stenosis.   At L5-S1, there is mild disc bulging and small joint effusions.  No nerve root compression or spinal stenosis.   REVIEW OF SYSTEMS:  Constitutional: No fevers, chills, sweats, or change in appetite.  She has fatigue and hypersomnia Eyes:  She notes double vision.   No eye pain Ear, nose and throat: No hearing loss, ear pain, nasal congestion, sore throat Cardiovascular: No chest pain, palpitations Respiratory:  No shortness of breath at rest or with exertion.   No wheezes GastrointestinaI: No nausea, vomiting, diarrhea, abdominal pain, fecal incontinence Genitourinary:  see above. Musculoskeletal:  No neck pain, back pain Integumentary: No rash, pruritus, skin lesions Neurological: as above Psychiatric: No depression at this time (some in past)  No anxiety Endocrine: No palpitations, diaphoresis, change in appetite, change in weigh or  increased thirst Hematologic/Lymphatic:  No anemia, purpura, petechiae. Allergic/Immunologic: No itchy/runny eyes, nasal congestion, recent allergic reactions, rashes  ALLERGIES: Allergies  Allergen Reactions   Naltrexone-Bupropion  Hcl Er Swelling   Penicillins Other (See Comments)    Causes yeast infections    HOME MEDICATIONS: Outpatient Medications Prior to Visit  Medication Sig  Dispense Refill   Cholecalciferol 50 MCG (2000 UT) TABS Take 2,000 Units by mouth.     dalfampridine  10 MG TB12 TAKE 1 TABLET EVERY 12 HOURS 180 tablet 3   escitalopram  (LEXAPRO ) 10 MG tablet Take 1 tablet (10 mg total) by mouth daily. 30 tablet 5   levonorgestrel  (MIRENA ) 20 MCG/DAY IUD 1 each by Intrauterine route once. Inserted 06/22/18     methylPREDNISolone  (MEDROL ) 4 MG tablet Taper from 6 pills po for one day to 1 pill po the last day over 6 days (Patient not taking: Reported on 02/20/2024) 21 tablet 0   mirabegron  ER (MYRBETRIQ ) 50 MG TB24 tablet Take 1 tablet (50 mg total) by mouth daily. 30 tablet 11   omeprazole (PRILOSEC) 40 MG capsule Take 40 mg by mouth daily.   11   phentermine  37.5 MG capsule TAKE 1 CAPSULE(37.5 MG) BY MOUTH EVERY MORNING 30 capsule 5   polyethylene glycol powder (GLYCOLAX/MIRALAX) 17 GM/SCOOP powder Take by mouth.     TIROSINT 200 MCG CAPS Take 1 capsule by mouth daily. 224 mg daily     tolterodine  (DETROL  LA) 4 MG 24 hr capsule TAKE 1 CAPSULE(4 MG) BY MOUTH DAILY 90 capsule 3   naproxen  (NAPROSYN ) 500 MG tablet Take 1 tablet (500 mg total) by mouth every 12 (twelve) hours as needed. Stop the etodolac . (Patient not taking: Reported on 02/20/2024) 60 tablet 3   Teriflunomide  14 MG TABS 1 p.o. daily (Patient not taking: Reported on 09/24/2024) 90 tablet 3   Facility-Administered Medications Prior to Visit  Medication Dose Route Frequency Provider Last Rate Last Admin   gadopentetate dimeglumine  (MAGNEVIST ) injection 20 mL  20 mL Intravenous Once PRN Vear Charlie LABOR, MD         PAST MEDICAL HISTORY: Past Medical History:  Diagnosis Date   Anemia    Arthritis    Cancer (HCC)    Common migraine 06/16/2017   Movement disorder    Multiple sclerosis    Thyroid cancer (HCC)    2010   Thyroid disease    Vision abnormalities     PAST SURGICAL HISTORY: Past Surgical History:  Procedure Laterality Date   CESAREAN SECTION     IUD REMOVAL N/A 12/27/2014   Procedure: INTRAUTERINE DEVICE (IUD) REMOVAL;  Surgeon: Marie-Lyne Lavoie, MD;  Location: [facility];  Service: Gynecology;  Laterality: N/A;   JOINT REPLACEMENT     KNEE SURGERY     LAPAROSCOPY N/A 12/27/2014   Procedure: LAPAROSCOPY DIAGNOSTIC ;  Surgeon: Percilla Burly, MD;  Location: [facility];  Service: Gynecology;  Laterality: N/A;   PATELLA RECONSTRUCTION     spinal ablation     THYROIDECTOMY      FAMILY HISTORY: Family History  Problem Relation Age of Onset   Healthy Mother    Gout Mother    Cancer Father        throat    Healthy Sister    Healthy Sister    Healthy Brother    Breast cancer Maternal Grandmother 55   Cancer Maternal Grandfather        bone    Heart disease Paternal Grandmother    Heart disease Paternal Grandfather     SOCIAL HISTORY:  Social History   Socioeconomic History   Marital status: Married    Spouse name: Not on file   Number of children: Not on file   Years of education: Not on file   Highest education level: Not on file  Occupational History   Not  on file  Tobacco Use   Smoking status: Former    Current packs/day: 0.00    Types: Cigarettes    Quit date: 10/02/2003    Years since quitting: 20.9   Smokeless tobacco: Never  Vaping Use   Vaping status: Never Used  Substance and Sexual Activity   Alcohol use: No    Alcohol/week: 0.0 standard drinks of alcohol   Drug use: No   Sexual activity: Yes    Partners: Male    Birth control/protection: Surgical    Comment: 1st intercourse- 16, partners- 20, married- 24 yrs, BTL  Other Topics Concern   Not  on file  Social History Narrative   Not on file   Social Drivers of Health   Tobacco Use: Medium Risk (09/24/2024)   Patient History    Smoking Tobacco Use: Former    Smokeless Tobacco Use: Never    Passive Exposure: Not on file  Financial Resource Strain: Low Risk (11/15/2022)   Received from [facility]   Overall Financial Resource Strain (CARDIA)    Difficulty of Paying Living Expenses: Not very hard  Food Insecurity: Low Risk (11/01/2023)   Received from [facility]   Epic    Within the past 12 months, you worried that your food would run out before you got money to buy more: Never true    Within the past 12 months, the food you bought just didn't last and you didn't have money to get more. : Never true  Transportation Needs: No Transportation Needs (11/01/2023)   Received from [facility]    In the past 12 months, has lack of reliable transportation kept you from medical appointments, meetings, work or from getting things needed for daily living? : No  Physical Activity: Insufficiently Active (11/15/2022)   Received from [facility]   Exercise Vital Sign    On average, how many days per week do you engage in moderate to strenuous exercise (like a brisk walk)?: 2 days    On average, how many minutes do you engage in exercise at this level?: 10 min  Stress: Stress Concern Present (11/15/2022)   Received from [facility] of Occupational Health - Occupational Stress Questionnaire    Feeling of Stress : To some extent  Social Connections: Moderately Integrated (11/15/2022)   Received from [facility]   Social Network    How would you rate your social network (family, work, friends)?: Adequate participation with social networks  Intimate Partner Violence: Not At Risk (11/15/2022)   Received from [facility]   HITS    Over the last 12 months how often did your partner physically hurt you?: Never    Over the last 12 months how often did your  partner insult you or talk down to you?: Never    Over the last 12 months how often did your partner threaten you with physical harm?: Never    Over the last 12 months how often did your partner scream or curse at you?: Never  Depression (PHQ2-9): Not on file  Alcohol Screen: Not on file  Housing: Low Risk (11/01/2023)   Received from [facility]   Epic    What is your living situation today?: I have a steady place to live    Think about the place you live. Do you have problems with any of the following? Choose all that apply:: None/None on this list  Utilities: Low Risk (11/01/2023)   Received from [facility]   Utilities    In the past 12 months has the electric, gas, oil, or water company threatened to shut off services in your home? : No  Health Literacy: Not on file     PHYSICAL EXAM  Vitals:   09/24/24 0834  BP: 109/76  Pulse: 100  SpO2: 98%  Weight: 269 lb 8 oz (122.2 kg)  Height: 5' 1 (1.549 m)    Body mass index is 50.92 kg/m.    General: The patient is an overweight woman in no acute distress.  She is tender over the lower lumbar region, right greater than left  Neurologic Exam  Mental status: The patient is alert and oriented x 3 at the time of the examination. The patient has apparent normal recent and remote memory, with reduced attention span and concentration ability.   A couple times she had some word finding difficulty    Cranial nerves: There is mild disconjugate gaze looking for left..  Color vision is desaturated OS.  Visual acuity was symmetric.    Facial strength is normal.  Hearing appears to be normal.  Motor:  Muscle bulk is normal.  She has increased muscle tone in the legs.  Strength is 5/5 in the arms and 4+/5 in the legs, left worse than right  Sensory: She has reduced vibration sensation in the right knee relative to the left.  She has reduced touch sensation in left leg relative to right.  Coordination: Cerebellar testing shows slightly  reduced finger-nose-finger on the left.  She has reduced heel -to-shin bilaterally  Gait and station: Station is slightly wide.  Gait is oor due to right leg weakness and ataxia.  .  She needs walker to do more than a couple steps. . The Romberg is positive.  Reflexes: Deep tendon reflexes are symmetric and brisk bilaterally.  No ankle clonus.    ASSESSMENT AND PLAN  Active secondary progressive multiple sclerosis - Plan: VAS US  LOWER EXTREMITY VENOUS (DVT), QuantiFERON-TB Gold Plus, HIV Antibody (routine testing w rflx), Hepatitis B Core AB, Total, Hepatitis B surface antibody,qualitative, Hepatitis C antibody, CBC with Differential/Platelet  High risk medication use - Plan: QuantiFERON-TB Gold Plus, HIV Antibody (routine testing w rflx), Hepatitis B Core AB, Total, Hepatitis B surface antibody,qualitative, Hepatitis C antibody, CBC with Differential/Platelet  Carcinoma of thyroid gland (HCC)  Urge incontinence  Abnormality of gait  Other fatigue  Leg swelling - Plan: VAS US  LOWER EXTREMITY VENOUS (DVT)  Pain in both lower extremities - Plan: VAS US  LOWER EXTREMITY VENOUS (DVT)   1.  We discussed options - due to recent breakthrough, she needs to get back on a DMT - we will start Mavenclad.  Check chronic infection labs.. 2.   She has bilateral , L>R, leg swelling and pain and we will check vascular study to r/o DVT 3.   She has multiple spinal cord and brain MS plaques.   She is disabled due to the combination of physical and cognitive impairment.  Cognition has slowly worsened over last few years as is typical with secondary progressive MS, even in the absence of new lesions on MRI.   She has significant fatigue.    4.  Continue dalfampridine . 5.   Continue Detrol  and escitalopram  She will return to see me in 6 months sooner if new or worsening neurologic symptoms.   42-minute office visit with the majority of the time spent face-to-face for history and physical,  discussion/counseling and decision-making.  Additional time with record  review and documentation.   Jayleana Colberg A. Vear, MD, PhD 09/24/2024, 11:47 AM Certified in Neurology, Clinical Neurophysiology, Sleep Medicine, Pain Medicine and Neuroimaging  [facility] [address], [address] [place], [place] [address] [phone] "

## 2024-09-25 ENCOUNTER — Encounter: Payer: Self-pay | Admitting: Neurology

## 2024-09-26 ENCOUNTER — Encounter: Payer: Self-pay | Admitting: Neurology

## 2024-09-26 ENCOUNTER — Ambulatory Visit (HOSPITAL_COMMUNITY)
Admission: RE | Admit: 2024-09-26 | Discharge: 2024-09-26 | Disposition: A | Discharge status: Home / Self Care | Source: Ambulatory Visit | Attending: Neurology | Admitting: Neurology

## 2024-09-26 ENCOUNTER — Ambulatory Visit: Payer: Self-pay | Admitting: Neurology

## 2024-09-26 DIAGNOSIS — M7989 Other specified soft tissue disorders: Secondary | ICD-10-CM | POA: Insufficient documentation

## 2024-09-26 DIAGNOSIS — M79604 Pain in right leg: Secondary | ICD-10-CM | POA: Insufficient documentation

## 2024-09-26 DIAGNOSIS — M79605 Pain in left leg: Secondary | ICD-10-CM | POA: Insufficient documentation

## 2024-09-26 DIAGNOSIS — G35C1 Active secondary progressive multiple sclerosis: Secondary | ICD-10-CM | POA: Diagnosis not present

## 2024-09-26 LAB — CBC WITH DIFFERENTIAL/PLATELET
Basophils Absolute: 0.1 x10E3/uL (ref 0.0–0.2)
Basos: 1 %
EOS (ABSOLUTE): 0.2 x10E3/uL (ref 0.0–0.4)
Eos: 3 %
Hematocrit: 37.7 % (ref 34.0–46.6)
Hemoglobin: 11.8 g/dL (ref 11.1–15.9)
Immature Grans (Abs): 0 x10E3/uL (ref 0.0–0.1)
Immature Granulocytes: 0 %
Lymphocytes Absolute: 1.8 x10E3/uL (ref 0.7–3.1)
Lymphs: 31 %
MCH: 27.8 pg (ref 26.6–33.0)
MCHC: 31.3 g/dL — ABNORMAL LOW (ref 31.5–35.7)
MCV: 89 fL (ref 79–97)
Monocytes Absolute: 0.4 x10E3/uL (ref 0.1–0.9)
Monocytes: 7 %
Neutrophils Absolute: 3.4 x10E3/uL (ref 1.4–7.0)
Neutrophils: 58 %
Platelets: 244 x10E3/uL (ref 150–450)
RBC: 4.25 x10E6/uL (ref 3.77–5.28)
RDW: 13.4 % (ref 11.7–15.4)
WBC: 5.7 x10E3/uL (ref 3.4–10.8)

## 2024-09-26 LAB — QUANTIFERON-TB GOLD PLUS
QuantiFERON Mitogen Value: 3.26 [IU]/mL
QuantiFERON Nil Value: 0.04 [IU]/mL
QuantiFERON TB1 Ag Value: 0.09 [IU]/mL
QuantiFERON TB2 Ag Value: 0.12 [IU]/mL

## 2024-09-26 LAB — HEPATITIS B SURFACE ANTIBODY,QUALITATIVE: Hep B Surface Ab, Qual: NONREACTIVE

## 2024-09-26 LAB — HEPATITIS C ANTIBODY: Hep C Virus Ab: NONREACTIVE

## 2024-09-26 LAB — HIV ANTIBODY (ROUTINE TESTING W REFLEX): HIV Screen 4th Generation wRfx: NONREACTIVE

## 2024-09-26 LAB — HEPATITIS B CORE ANTIBODY, TOTAL: Hep B Core Total Ab: NEGATIVE

## 2024-09-27 ENCOUNTER — Telehealth: Payer: Self-pay

## 2024-09-27 NOTE — Telephone Encounter (Signed)
 Allison Guzman

## 2024-09-28 NOTE — Telephone Encounter (Signed)
 Per start form, patient should start in early March. Will wait to do PA until closer to then.

## 2024-10-01 NOTE — Telephone Encounter (Signed)
 Pt has been informed and she verbalized appreciation.

## 2024-10-03 ENCOUNTER — Encounter: Payer: Self-pay | Admitting: Neurology

## 2024-10-08 ENCOUNTER — Encounter: Payer: Self-pay | Admitting: Neurology

## 2024-10-08 MED ORDER — PREDNISONE 50 MG PO TABS: ORAL_TABLET | ORAL | 0 refills | Status: AC

## 2024-10-08 NOTE — Telephone Encounter (Signed)
"   I spoke to Allison Guzman. Since 2 days ago she has had vertigo and now is experiencing tingling in her fingers. She is concerned as one of her early exacerbations involved vertigo that felt similar to her exacerbation shortly after her diagnosis years ago.  I will go ahead and send in high-dose prednisone  to take 1000 mg x 4 days. Additionally I will see if we can get her Mavenclad authorized sooner so that she can get started on the [facility].  She can do her second Shingrix vaccination after she begins the [facility].  She had chickenpox as a child. "

## 2024-10-08 NOTE — Telephone Encounter (Signed)
 I spoke to Allison Guzman.   Since 2 days ago she has had vertigo and now is experiencing tingling in her fingers.  She is concerned as one of her early exacerbations involved vertigo that felt similar to her.

## 2024-10-09 NOTE — Telephone Encounter (Signed)
 Completed PA for Mavenclad 10 tablets via CMM and sent to [employer].  Patient's diagnosis is active secondary progressive MS. Key: [id number].  Should have a determination within 3-5 business days.

## 2024-10-09 NOTE — Telephone Encounter (Signed)
 Per Javie at [facility]: Just an update on this pt . We havent been able to reach pt to complete the welcome call . Can you please have pt f/u with [facility] .

## 2024-10-16 NOTE — Telephone Encounter (Signed)
 Allison Guzman

## 2024-10-17 NOTE — Telephone Encounter (Signed)
 Appeal letter written and printed to [facility] for Dr. Duncan review and signature.  It can be faxed, along with last OV notes, to [employer] at [phone].

## 2024-10-17 NOTE — Telephone Encounter (Signed)
Placed for signature 

## 2024-10-19 ENCOUNTER — Encounter: Payer: Self-pay | Admitting: Neurology

## 2024-10-23 ENCOUNTER — Ambulatory Visit: Admitting: Neurology

## 2025-05-16 ENCOUNTER — Ambulatory Visit: Admitting: Neurology
